# Patient Record
Sex: Female | Born: 1990 | Race: White | Hispanic: Yes | Marital: Single | Smoking: Former smoker
Health system: Southern US, Community
[De-identification: ages and names within clinical notes are randomized; demographics above are authoritative.]

## PROBLEM LIST (undated history)

## (undated) DIAGNOSIS — F419 Anxiety disorder, unspecified: Secondary | ICD-10-CM

## (undated) DIAGNOSIS — J45909 Unspecified asthma, uncomplicated: Secondary | ICD-10-CM

## (undated) DIAGNOSIS — E282 Polycystic ovarian syndrome: Secondary | ICD-10-CM

## (undated) DIAGNOSIS — N809 Endometriosis, unspecified: Secondary | ICD-10-CM

## (undated) DIAGNOSIS — R519 Headache, unspecified: Secondary | ICD-10-CM

## (undated) DIAGNOSIS — F32A Depression, unspecified: Secondary | ICD-10-CM

## (undated) DIAGNOSIS — Z809 Family history of malignant neoplasm, unspecified: Secondary | ICD-10-CM

## (undated) DIAGNOSIS — Z8049 Family history of malignant neoplasm of other genital organs: Secondary | ICD-10-CM

## (undated) HISTORY — DX: Family history of malignant neoplasm of other genital organs: Z80.49

## (undated) HISTORY — DX: Family history of malignant neoplasm, unspecified: Z80.9

---
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
## 2017-07-07 DIAGNOSIS — J4530 Mild persistent asthma, uncomplicated: Secondary | ICD-10-CM | POA: Insufficient documentation

## 2017-07-07 DIAGNOSIS — F988 Other specified behavioral and emotional disorders with onset usually occurring in childhood and adolescence: Secondary | ICD-10-CM | POA: Insufficient documentation

## 2017-07-07 DIAGNOSIS — M7021 Olecranon bursitis, right elbow: Secondary | ICD-10-CM | POA: Insufficient documentation

## 2017-07-07 DIAGNOSIS — R102 Pelvic and perineal pain: Secondary | ICD-10-CM | POA: Insufficient documentation

## 2017-09-14 ENCOUNTER — Emergency Department (HOSPITAL_COMMUNITY)
Admission: EM | Admit: 2017-09-14 | Discharge: 2017-09-14 | Disposition: A | Discharge status: Home / Self Care | Payer: BLUE CROSS/BLUE SHIELD | Attending: Emergency Medicine | Admitting: Emergency Medicine

## 2017-09-14 ENCOUNTER — Other Ambulatory Visit: Payer: Self-pay

## 2017-09-14 ENCOUNTER — Encounter (HOSPITAL_COMMUNITY): Payer: Self-pay | Admitting: Emergency Medicine

## 2017-09-14 ENCOUNTER — Emergency Department (HOSPITAL_COMMUNITY): Payer: BLUE CROSS/BLUE SHIELD

## 2017-09-14 DIAGNOSIS — N83202 Unspecified ovarian cyst, left side: Secondary | ICD-10-CM | POA: Insufficient documentation

## 2017-09-14 DIAGNOSIS — N83201 Unspecified ovarian cyst, right side: Secondary | ICD-10-CM | POA: Insufficient documentation

## 2017-09-14 DIAGNOSIS — Z79899 Other long term (current) drug therapy: Secondary | ICD-10-CM | POA: Insufficient documentation

## 2017-09-14 LAB — COMPREHENSIVE METABOLIC PANEL
ALK PHOS: 54 U/L (ref 38–126)
ALT: 16 U/L (ref 14–54)
ANION GAP: 9 (ref 5–15)
AST: 20 U/L (ref 15–41)
Albumin: 3.9 g/dL (ref 3.5–5.0)
BUN: 13 mg/dL (ref 6–20)
CHLORIDE: 102 mmol/L (ref 101–111)
CO2: 26 mmol/L (ref 22–32)
Calcium: 8.7 mg/dL — ABNORMAL LOW (ref 8.9–10.3)
Creatinine, Ser: 0.73 mg/dL (ref 0.44–1.00)
GFR calc Af Amer: >60 mL/min (ref >60)
GFR calc non Af Amer: >60 mL/min (ref >60)
GLUCOSE: 96 mg/dL (ref 65–99)
POTASSIUM: 3.9 mmol/L (ref 3.5–5.1)
Sodium: 137 mmol/L (ref 135–145)
Total Bilirubin: 0.8 mg/dL (ref 0.3–1.2)
Total Protein: 6.4 g/dL — ABNORMAL LOW (ref 6.5–8.1)

## 2017-09-14 LAB — URINALYSIS, ROUTINE W REFLEX MICROSCOPIC
BILIRUBIN URINE: NEGATIVE
Bacteria, UA: NONE SEEN
GLUCOSE, UA: NEGATIVE mg/dL
KETONES UR: NEGATIVE mg/dL
LEUKOCYTES UA: NEGATIVE
Nitrite: NEGATIVE
PH: 5 (ref 5.0–8.0)
PROTEIN: NEGATIVE mg/dL
Specific Gravity, Urine: 1.02 (ref 1.005–1.030)

## 2017-09-14 LAB — CBC
HEMATOCRIT: 37.8 % (ref 36.0–46.0)
HEMOGLOBIN: 12.5 g/dL (ref 12.0–15.0)
MCH: 31.7 pg (ref 26.0–34.0)
MCHC: 33.1 g/dL (ref 30.0–36.0)
MCV: 95.9 fL (ref 78.0–100.0)
Platelets: 272 10*3/uL (ref 150–400)
RBC: 3.94 MIL/uL (ref 3.87–5.11)
RDW: 13 % (ref 11.5–15.5)
WBC: 7.5 10*3/uL (ref 4.0–10.5)

## 2017-09-14 LAB — I-STAT BETA HCG BLOOD, ED (MC, WL, AP ONLY): I-stat hCG, quantitative: <5 m[IU]/mL (ref <5)

## 2017-09-14 LAB — LIPASE, BLOOD: Lipase: 29 U/L (ref 11–51)

## 2017-09-14 MED ORDER — KETOROLAC TROMETHAMINE 15 MG/ML IJ SOLN
15.0000 mg | Freq: Once | INTRAMUSCULAR | Status: AC
  Administered 2017-09-14: 15 mg via INTRAVENOUS
  Filled 2017-09-14: qty 1

## 2017-09-14 MED ORDER — SODIUM CHLORIDE 0.9 % IV BOLUS (SEPSIS)
1000.0000 mL | Freq: Once | INTRAVENOUS | Status: AC
  Administered 2017-09-14: 1000 mL via INTRAVENOUS

## 2017-09-14 MED ORDER — IOPAMIDOL (ISOVUE-300) INJECTION 61%
INTRAVENOUS | Status: AC
Start: 2017-09-14 — End: 2017-09-14
  Administered 2017-09-14: 100 mL
  Filled 2017-09-14: qty 100

## 2017-09-14 NOTE — ED Notes (Signed)
Pt cannot void at this time but is aware of need for UA

## 2017-09-14 NOTE — ED Triage Notes (Signed)
Patient with abdominal pain off and on since August.  Patient states that they thought that she might have endometriosis.  She has been having a constant sharp pain on the right side of her abdomen.  She is having a burning sensation.  It radiates from vagina to her rectum.  Patient does have some nausea, no vomiting.

## 2017-09-14 NOTE — ED Provider Notes (Signed)
[facility] EMERGENCY DEPARTMENT Provider Note   CSN: [id number] Arrival date & time: 09/14/17  6160     History   Chief Complaint Chief Complaint  Patient presents with  . Abdominal Pain    HPI Lori Collier is a 26 y.o. female.  26 year old female presents with complaint of abdominal pain.  Patient reports that she has had intermittent abdominal pain on and off for several weeks.  Patient reports that her pain may have been ongoing since August.  She reports the pain was slightly worse today so she decided to come to the ED.  Patient took a Naprosyn prior to arrival which has now resolved her pain.  Patient denies associated nausea, vomiting, fever, bowel movement change, urinary change, vaginal bleeding, or other complaint.  Patient reports that she has been told that she has cyst on her ovaries in the past.  She describes the pain as being a crampy discomfort to the lower abdomen.  Patient reports the pain is sometimes on the right and sometimes on the left.  She reports prior appendectomy.  Denies history of STD.  She denies any other abdominal surgeries.   The history is provided by the patient.  Abdominal Pain   This is a chronic problem. The current episode started more than 1 week ago. The problem occurs every several days. The problem has been resolved. The pain is located in the RLQ and LLQ. The patient is experiencing no pain. Nothing relieves the symptoms.    History reviewed. No pertinent past medical history.  There are no active problems to display for this patient.   Past Surgical History:  Procedure Laterality Date  . APPENDECTOMY      OB History    No data available       Home Medications    Prior to Admission medications   Medication Sig Start Date End Date Taking? Authorizing Provider  ADDERALL XR 10 MG 24 hr capsule Take 10 mg by mouth daily. 09/13/17  Yes [provider]  mometasone-formoterol (DULERA) 100-5 MCG/ACT  AERO Inhale 2 puffs into the lungs daily.   Yes [provider]  naproxen sodium (ALEVE) 220 MG tablet Take 220 mg by mouth 2 (two) times daily as needed (pain).   Yes [provider]  phenazopyridine (PYRIDIUM) 100 MG tablet 1 po tid with food Patient not taking: Reported on 09/14/2017 09/21/11   Lily Kocher, PA-C    Family History No family history on file.  Social History Social History   Tobacco Use  . Smoking status: Never Smoker  Substance Use Topics  . Alcohol use: No  . Drug use: No     Allergies   Patient has no known allergies.   Review of Systems Review of Systems  Gastrointestinal: Positive for abdominal pain.  All other systems reviewed and are negative.    Physical Exam Updated Vital Signs BP 101/64 (BP Location: Right Arm)   Pulse 79   Temp 98.3 F (36.8 C) (Oral)   Resp 16   Ht 5\' 5"  (1.651 m)   Wt 56.2 kg (124 lb)   LMP 09/14/2017   SpO2 100%   BMI 20.63 kg/m   Physical Exam  Constitutional: She is oriented to person, place, and time. She appears well-developed and well-nourished. No distress.  HENT:  Head: Normocephalic and atraumatic.  Mouth/Throat: Oropharynx is clear and moist.  Eyes: Conjunctivae and EOM are normal. Pupils are equal, round, and reactive to light.  Neck: Normal  range of motion. Neck supple.  Cardiovascular: Normal rate, regular rhythm and normal heart sounds.  Pulmonary/Chest: Effort normal and breath sounds normal. No respiratory distress.  Abdominal: Soft. Bowel sounds are normal. She exhibits no distension. There is no tenderness.  Musculoskeletal: Normal range of motion. She exhibits no edema or deformity.  Neurological: She is alert and oriented to person, place, and time.  Skin: Skin is warm and dry.  Psychiatric: She has a normal mood and affect.  Nursing note and vitals reviewed.    ED Treatments / Results  Labs (all labs ordered are listed, but only abnormal results are displayed) Labs  Reviewed  COMPREHENSIVE METABOLIC PANEL - Abnormal; Notable for the following components:      Result Value   Calcium 8.7 (*)    Total Protein 6.4 (*)    All other components within normal limits  URINALYSIS, ROUTINE W REFLEX MICROSCOPIC - Abnormal; Notable for the following components:   Hgb urine dipstick SMALL (*)    Squamous Epithelial / LPF 0-5 (*)    All other components within normal limits  LIPASE, BLOOD  CBC  URINALYSIS, ROUTINE W REFLEX MICROSCOPIC  I-STAT BETA HCG BLOOD, ED ([facility], [facility], [facility] ONLY)    EKG  EKG Interpretation None       Radiology Ct Abdomen Pelvis W Contrast  Result Date: 09/14/2017 CLINICAL DATA:  Right lower quadrant pain since August. EXAM: CT ABDOMEN AND PELVIS WITH CONTRAST TECHNIQUE: Multidetector CT imaging of the abdomen and pelvis was performed using the standard protocol following bolus administration of intravenous contrast. CONTRAST:  179mL ISOVUE-300 IOPAMIDOL (ISOVUE-300) INJECTION 61% COMPARISON:  CT abdomen pelvis dated October 28, 2010. FINDINGS: Lower chest: No acute abnormality. Hepatobiliary: Two subcentimeter low-density lesions in the right liver are too small to characterize. No gallstones, gallbladder wall thickening, or biliary dilatation. Pancreas: Unremarkable. No pancreatic ductal dilatation or surrounding inflammatory changes. Spleen: Normal in size without focal abnormality. Adrenals/Urinary Tract: Adrenal glands are unremarkable. Kidneys are normal, without renal calculi, focal lesion, or hydronephrosis. Bladder is unremarkable. Stomach/Bowel: Stomach is within normal limits. Appendix is surgically absent. No evidence of bowel wall thickening, distention, or inflammatory changes. Vascular/Lymphatic: No significant vascular findings are present. No enlarged abdominal or pelvic lymph nodes. Reproductive: The uterus is unremarkable. There are low-density, cystic lesions in both adnexa. The one on the right measures 7.2 x 4.2 cm, and one on the  left measures 5.2 x 2.8 cm. Other: Trace free fluid in the pelvis, likely physiologic. No abdominal wall hernia or abnormality. No pneumoperitoneum. Musculoskeletal: No acute or significant osseous findings. IMPRESSION: 1. Benign-appearing bilateral adnexal low-density cystic lesions, measuring up to 7.2 cm. Pelvic ultrasound in 6-12 weeks is recommended to evaluate for resolution. This recommendation follows ACR consensus guidelines: White Paper of the ACR Incidental Findings Committee II on Adnexal Findings. J Am Coll Radiol [phone]. Electronically Signed   By: Titus Dubin M.D.   On: 09/14/2017 12:44    Procedures Procedures (including critical care time)  Medications Ordered in ED Medications  sodium chloride 0.9 % bolus 1,000 mL (0 mLs Intravenous Stopped 09/14/17 1253)  iopamidol (ISOVUE-300) 61 % injection (100 mLs  Contrast Given 09/14/17 1200)  ketorolac (TORADOL) 15 MG/ML injection 15 mg (15 mg Intravenous Given 09/14/17 1318)     Initial Impression / Assessment and Plan / ED Course  I have reviewed the triage vital signs and the nursing notes.  Pertinent labs & imaging results that were available during my care of the patient were  reviewed by me and considered in my medical decision making (see chart for details).     MSE complete  Patient's chronic pain is most likely related to her ovarian cysts - perhaps intermittent leakage from the cyst.  I do not suspect ovarian torsion.  There is no evidence of other significant pathology on her ED workup.  The patient has been pain-free and comfortable during her ED workup.  She has been instructed that close follow-up with GYN for repeat imaging studies within the next 6 weeks as appropriate.  She was instructed in the use of anti-inflammatories such as Naprosyn (or toradol) for pain control.   Strict return precautions were given and understood.  She was educated about the distant possibility of ovarian torsion and understands  the specific return instructions regarding ovarian torsion.     Final Clinical Impressions(s) / ED Diagnoses   Final diagnoses:  Cysts of both ovaries    ED Discharge Orders    None       Valarie Merino, MD 09/14/17 1342

## 2017-12-21 ENCOUNTER — Other Ambulatory Visit: Payer: Self-pay | Admitting: Advanced Practice Midwife

## 2017-12-21 DIAGNOSIS — R102 Pelvic and perineal pain: Secondary | ICD-10-CM

## 2017-12-21 DIAGNOSIS — N83209 Unspecified ovarian cyst, unspecified side: Secondary | ICD-10-CM

## 2018-01-05 ENCOUNTER — Ambulatory Visit
Admission: RE | Admit: 2018-01-05 | Discharge: 2018-01-05 | Disposition: A | Discharge status: Home / Self Care | Payer: Self-pay | Source: Ambulatory Visit | Attending: Advanced Practice Midwife | Admitting: Advanced Practice Midwife

## 2018-01-05 DIAGNOSIS — R102 Pelvic and perineal pain: Secondary | ICD-10-CM

## 2018-01-05 DIAGNOSIS — N83209 Unspecified ovarian cyst, unspecified side: Secondary | ICD-10-CM

## 2018-02-15 ENCOUNTER — Encounter: Payer: Self-pay | Admitting: Obstetrics & Gynecology

## 2018-02-15 ENCOUNTER — Ambulatory Visit (INDEPENDENT_AMBULATORY_CARE_PROVIDER_SITE_OTHER): Payer: Self-pay | Admitting: Obstetrics & Gynecology

## 2018-02-15 VITALS — BP 112/78 | Ht 64.25 in | Wt 118.0 lb

## 2018-02-15 DIAGNOSIS — R102 Pelvic and perineal pain: Secondary | ICD-10-CM

## 2018-02-15 DIAGNOSIS — N83202 Unspecified ovarian cyst, left side: Secondary | ICD-10-CM

## 2018-02-15 DIAGNOSIS — N83201 Unspecified ovarian cyst, right side: Secondary | ICD-10-CM

## 2018-02-15 DIAGNOSIS — N898 Other specified noninflammatory disorders of vagina: Secondary | ICD-10-CM

## 2018-02-15 LAB — WET PREP FOR TRICH, YEAST, CLUE

## 2018-02-15 MED ORDER — TINIDAZOLE 500 MG PO TABS: 2.0000 g | ORAL_TABLET | Freq: Every day | ORAL | 0 refills | Status: AC

## 2018-02-15 MED ORDER — NORGESTIMATE-ETH ESTRADIOL 0.25-35 MG-MCG PO TABS: 1.0000 | ORAL_TABLET | Freq: Every day | ORAL | 4 refills | Status: DC

## 2018-02-15 NOTE — Progress Notes (Signed)
Lori Collier Sep 19, 1991 [id number]        27 y.o.  G0 Single  RP: Pelvic pain with persistent ovarian cysts  HPI: Menstrual periods are not regular but no oligomenorrhea.  Periods about every 3 to 4 weeks.  Normal flow.  Patient complains of frequent low abdominal pain not always in the same location.  Currently abstinent, but has had many sexual partners in the past.  Condoms if sexually active.  No history of STDs.  Has had ovarian cysts per pelvic ultrasound and possibly ovarian cyst rupture at times.  She had a pelvic ultrasound in October 2018 and then a repeat US in March 2019.  On both occasions there were multiple ovarian cysts.  Minor facial acne and very mild increase hair growth at the chin and at the areolas of both breasts.  Patient has not been investigated for polycystic ovarian syndrome.  No previous gynecologic surgery.  Patient was reluctant of being started on birth control pills as she felt that it did not prevent ovarian cysts in the past.  She has tried to improve her condition by modifying her nutrition.   OB History  Gravida Para Term Preterm AB Living  0 0 0 0 0 0  SAB TAB Ectopic Multiple Live Births  0 0 0 0 0    Past medical history,surgical history, problem list, medications, allergies, family history and social history were all reviewed and documented in the EPIC chart.   Directed ROS with pertinent positives and negatives documented in the history of present illness/assessment and plan.  Exam:  Vitals:   02/15/18 1433  BP: 112/78  Weight: 118 lb (53.5 kg)  Height: 5' 4.25" (1.632 m)   General appearance:  Normal  Abdomen: Normal  Gynecologic exam: Vulva normal.  Speculum: Cervix and vagina normal.  Increased vaginal discharge.  Wet prep was done.  Declines STD screening.   Bimanual: Uterus anteverted, normal volume, nontender, mobile.  Cervix nontender to mobilization.  Ovaries mildly increased in volume, but no large cyst palpated.  Very mildly  tender bilaterally.  Wet prep:  Clue cells present   Assessment/Plan:  27 y.o. G0  1. Pelvic pain in female Pelvic pain probably associated with PCOS and occasional rupture of ovarian cysts.  Labs drawn today and patient started on Sprintec.  Usage risks and benefits of birth control pill reviewed.  Will follow up with a pelvic ultrasound in 2 months. - US Transvaginal Non-OB; Future  2. Bilateral ovarian cysts Menstrual cycles are every 3 to 4 weeks currently.  Patient has been on Implanon and birth control pills before.  Persistent bilateral ovarian cysts possibly associated with PCOS, but endometrioma or benign ovarian tumor not ruled out.  No large ovarian cyst or adnexal mass felt on exam today.  Diagnosis of PCOS and treatment reviewed with patient.  Decision to start birth control pill with Sprintec.  Usage, risks and benefits reviewed with patient.  Patient is currently abstinent, will start the pill today but will not count on it for contraception with the first pack.  Condoms for STD prevention recommended anyways.  Labs today and follow-up with a pelvic ultrasound in 2 months.  Management per clinical, lab and ultrasound findings at follow-up. - TSH - Prolactin - Hemoglobin A1C - US Transvaginal Non-OB; Future  3. Vaginal discharge Increased vaginal discharge on exam.  Wet prep showed clue cells.  Bacterial Vaginosis diagnosis discussed with patient.  Will treat with tinidazole 4 tablets daily for 2  days.  Usage, risks and benefits reviewed.  Prescription sent to pharmacy.  Other orders - norgestimate-ethinyl estradiol (ORTHO-CYCLEN,SPRINTEC,PREVIFEM) 0.25-35 MG-MCG tablet; Take 1 tablet by mouth daily. - tinidazole (TINDAMAX) 500 MG tablet; Take 4 tablets (2,000 mg total) by mouth daily for 2 days.  Counseling on above issues and coordination of care more than 50% for 45 minutes.  [name] MD, 2:47 PM 02/15/2018

## 2018-02-16 ENCOUNTER — Encounter: Payer: Self-pay | Admitting: Obstetrics & Gynecology

## 2018-02-16 LAB — HEMOGLOBIN A1C
HEMOGLOBIN A1C: 5.2 %{Hb} (ref <5.7)
MEAN PLASMA GLUCOSE: 103 (calc)
eAG (mmol/L): 5.7 (calc)

## 2018-02-16 LAB — PROLACTIN: Prolactin: 5.4 ng/mL

## 2018-02-16 LAB — TSH: TSH: 2.3 mIU/L

## 2018-02-16 NOTE — Patient Instructions (Addendum)
1. Pelvic pain in female Pelvic pain probably associated with PCOS and occasional rupture of ovarian cysts.  Labs drawn today and patient started on Sprintec.  Will follow up with a pelvic ultrasound in 2 months. - US Transvaginal Non-OB; Future  2. Bilateral ovarian cysts Menstrual cycles are every 3 to 4 weeks currently.  Patient has been on Implanon and birth control pills before.  Persistent bilateral ovarian cysts possibly associated with PCOS, but endometrioma or benign ovarian tumor not ruled out.  No large ovarian cyst or adnexal mass felt on exam today.  Diagnosis of PCOS and treatment reviewed with patient.  Decision to start birth control pill with Sprintec.  Usage, risks and benefits reviewed with patient.  Patient is currently abstinent, will start the pill today but will not count on it for contraception with the first pack.  Condoms for STD prevention recommended anyways.  Labs today and follow-up with a pelvic ultrasound in 2 months.  Management per clinical, lab and ultrasound findings at follow-up. - TSH - Prolactin - Hemoglobin A1C - US Transvaginal Non-OB; Future  3. Vaginal discharge Increased vaginal discharge on exam.  Wet prep showed clue cells.  Bacterial Vaginosis diagnosis discussed with patient.  Will treat with tinidazole 4 tablets daily for 2 days.  Usage, risks and benefits reviewed.  Prescription sent to pharmacy.  Other orders - norgestimate-ethinyl estradiol (ORTHO-CYCLEN,SPRINTEC,PREVIFEM) 0.25-35 MG-MCG tablet; Take 1 tablet by mouth daily. - tinidazole (TINDAMAX) 500 MG tablet; Take 4 tablets (2,000 mg total) by mouth daily for 2 days.  Abreanna, it was a pleasure meeting you today!  I will inform you of your results as soon as they are available.  I will see you back in 2 months for the pelvic ultrasound.  Please let me know if your pain worsens and you need to be reevaluated earlier.   Diet for Polycystic Ovarian Syndrome Polycystic ovary syndrome (PCOS) is  a disorder of the chemical messengers (hormones) that regulate menstruation. The condition causes important hormones to be out of balance. PCOS can:  Make your periods irregular or stop.  Cause cysts to develop on the ovaries.  Make it difficult to get pregnant.  Stop your body from responding to the effects of insulin (insulin resistance), which can lead to obesity and diabetes.  Changing what you eat can help manage PCOS and improve your health. It can help you lose weight and improve the way your body uses insulin. What is my plan?  Eat breakfast, lunch, and dinner plus two snacks every day.  Include protein in each meal and snack.  Choose whole grains instead of products made with refined flour.  Eat a variety of foods.  Exercise regularly as told by your health care provider. What do I need to know about this eating plan? If you are overweight or obese, pay attention to how many calories you eat. Cutting down on calories can help you lose weight. Work with your health care provider or dietitian to figure out how many calories you need each day. What foods can I eat? Grains Whole grains, such as whole wheat. Whole-grain breads, crackers, cereals, and pasta. Unsweetened oatmeal, bulgur, barley, quinoa, or brown rice. Corn or whole-wheat flour tortillas. Vegetables  Lettuce. Spinach. Peas. Beets. Cauliflower. Cabbage. Broccoli. Carrots. Tomatoes. Squash. Eggplant. Herbs. Peppers. Onions. Cucumbers. Brussels sprouts. Fruits Berries. Bananas. Apples. Oranges. Grapes. Papaya. Mango. Pomegranate. Kiwi. Grapefruit. Cherries. Meats and Other Protein Sources Lean proteins, such as fish, chicken, beans, eggs, and tofu. Dairy Low-fat  dairy products, such as skim milk, cheese sticks, and yogurt. Beverages Low-fat or fat-free drinks, such as water, low-fat milk, sugar-free drinks, and 100% fruit juice. Condiments Ketchup. Mustard. Barbecue sauce. Relish. Low-fat or fat-free  mayonnaise. Fats and Oils Olive oil or canola oil. Walnuts and almonds. The items listed above may not be a complete list of recommended foods or beverages. Contact your dietitian for more options. What foods are not recommended? Foods high in calories or fat. Fried foods. Sweets. Products made from refined white flour, including white bread, pastries, white rice, and pasta. The items listed above may not be a complete list of foods and beverages to avoid. Contact your dietitian for more information. This information is not intended to replace advice given to you by your health care provider. Make sure you discuss any questions you have with your health care provider. Document Released: 01/20/2016 Document Revised: 03/05/2016 Document Reviewed: 10/10/2014 Elsevier Interactive Patient Education  2018 [employer].  Polycystic Ovarian Syndrome Polycystic ovarian syndrome (PCOS) is a common hormonal disorder among women of reproductive age. In most women with PCOS, many small fluid-filled sacs (cysts) grow on the ovaries, and the cysts are not part of a normal menstrual cycle. PCOS can cause problems with your menstrual periods and make it difficult to get pregnant. It can also cause an increased risk of miscarriage with pregnancy. If it is not treated, PCOS can lead to serious health problems, such as diabetes and heart disease. What are the causes? The cause of PCOS is not known, but it may be the result of a combination of certain factors, such as:  Irregular menstrual cycle.  High levels of certain hormones (androgens).  Problems with the hormone that helps to control blood sugar (insulin resistance).  Certain genes.  What increases the risk? This condition is more likely to develop in women who have a family history of PCOS. What are the signs or symptoms? Symptoms of PCOS may include:  Multiple ovarian cysts.  Infrequent periods or no periods.  Periods that are too frequent or  too heavy.  Unpredictable periods.  Inability to get pregnant (infertility) because of not ovulating.  Increased growth of hair on the face, chest, stomach, back, thumbs, thighs, or toes.  Acne or oily skin. Acne may develop during adulthood, and it may not respond to treatment.  Pelvic pain.  Weight gain or obesity.  Patches of thickened and dark brown or black skin on the neck, arms, breasts, or thighs (acanthosis nigricans).  Excess hair growth on the face, chest, abdomen, or upper thighs (hirsutism).  How is this diagnosed? This condition is diagnosed based on:  Your medical history.  A physical exam, including a pelvic exam. Your health care provider may look for areas of increased hair growth on your skin.  Tests, such as: ? Ultrasound. This may be used to examine the ovaries and the lining of the uterus (endometrium) for cysts. ? Blood tests. These may be used to check levels of sugar (glucose), female hormone (testosterone), and female hormones (estrogen and progesterone) in your blood.  How is this treated? There is no cure for PCOS, but treatment can help to manage symptoms and prevent more health problems from developing. Treatment varies depending on:  Your symptoms.  Whether you want to have a baby or whether you need birth control (contraception).  Treatment may include nutrition and lifestyle changes along with:  Progesterone hormone to start a menstrual period.  Birth control pills to help you have  regular menstrual periods.  Medicines to make you ovulate, if you want to get pregnant.  Medicine to reduce excessive hair growth.  Surgery, in severe cases. This may involve making small holes in one or both of your ovaries. This decreases the amount of testosterone that your body produces.  Follow these instructions at home:  Take over-the-counter and prescription medicines only as told by your health care provider.  Follow a healthy meal plan. This can  help you reduce the effects of PCOS. ? Eat a healthy diet that includes lean proteins, complex carbohydrates, fresh fruits and vegetables, low-fat dairy products, and healthy fats. Make sure to eat enough fiber.  If you are overweight, lose weight as told by your health care provider. ? Losing 10% of your body weight may improve symptoms. ? Your health care provider can determine how much weight loss is best for you and can help you lose weight safely.  Keep all follow-up visits as told by your health care provider. This is important. Contact a health care provider if:  Your symptoms do not get better with medicine.  You develop new symptoms. This information is not intended to replace advice given to you by your health care provider. Make sure you discuss any questions you have with your health care provider. Document Released: 01/22/2005 Document Revised: 05/26/2016 Document Reviewed: 03/15/2016 Elsevier Interactive Patient Education  2018 [employer].   Kegel Exercises Kegel exercises help strengthen the muscles that support the rectum, vagina, small intestine, bladder, and uterus. Doing Kegel exercises can help:  Improve bladder and bowel control.  Improve sexual response.  Reduce problems and discomfort during pregnancy.  Kegel exercises involve squeezing your pelvic floor muscles, which are the same muscles you squeeze when you try to stop the flow of urine. The exercises can be done while sitting, standing, or lying down, but it is best to vary your position. Phase 1 exercises 1. Squeeze your pelvic floor muscles tight. You should feel a tight lift in your rectal area. If you are a female, you should also feel a tightness in your vaginal area. Keep your stomach, buttocks, and legs relaxed. 2. Hold the muscles tight for up to 10 seconds. 3. Relax your muscles. Repeat this exercise 50 times a day or as many times as told by your health care provider. Continue to do this  exercise for at least 4-6 weeks or for as long as told by your health care provider. This information is not intended to replace advice given to you by your health care provider. Make sure you discuss any questions you have with your health care provider. Document Released: 09/14/2012 Document Revised: 05/23/2016 Document Reviewed: 08/18/2015 Elsevier Interactive Patient Education  [employer].

## 2018-02-18 ENCOUNTER — Encounter: Payer: Self-pay | Admitting: Obstetrics & Gynecology

## 2018-03-08 ENCOUNTER — Telehealth: Payer: Self-pay | Admitting: *Deleted

## 2018-03-08 ENCOUNTER — Encounter: Payer: Self-pay | Admitting: Obstetrics & Gynecology

## 2018-03-08 NOTE — Telephone Encounter (Signed)
Patient was seen on 02/15/18 states you wanted her to stop ovulation while on pills? She couldn't remember if you wanted her to skip placebo pills and start into a new pack? Please advise

## 2018-03-08 NOTE — Telephone Encounter (Signed)
Yes, recommend continuous use to skip menses.  So, take only active pills, discard placebo and go right into next pack with active pills.Marland KitchenMarland Kitchen

## 2018-03-09 NOTE — Telephone Encounter (Signed)
Pt informed

## 2018-03-16 ENCOUNTER — Telehealth: Payer: Self-pay | Admitting: *Deleted

## 2018-03-16 DIAGNOSIS — R399 Unspecified symptoms and signs involving the genitourinary system: Secondary | ICD-10-CM

## 2018-03-16 NOTE — Telephone Encounter (Signed)
Patient called c/o several different issues frequent urination, slight burning with urination, external vaginal itching. I advised her OV best for correct medication, pt declined stating she doesn't have insurance at the time. She wanted me to check with you to see if you would send in Rx for the above symptoms? I did tell her I can't promise you would approve. Please advise

## 2018-03-17 NOTE — Telephone Encounter (Signed)
Spoke with patient and advised her. Lab appointment scheduled for tomorrow (could not come today) and u/a w reflex culture order placed.

## 2018-03-17 NOTE — Telephone Encounter (Signed)
Tell patient, if she cannot pay for a visit, she needs to at least come for a clean catch U/A and Urine Culture before any treatment is decided.

## 2018-03-18 ENCOUNTER — Other Ambulatory Visit: Payer: Self-pay

## 2018-04-18 ENCOUNTER — Other Ambulatory Visit: Payer: Self-pay

## 2018-04-18 ENCOUNTER — Ambulatory Visit: Payer: Self-pay | Admitting: Obstetrics & Gynecology

## 2018-10-17 ENCOUNTER — Ambulatory Visit: Payer: BLUE CROSS/BLUE SHIELD | Admitting: Obstetrics & Gynecology

## 2018-10-17 ENCOUNTER — Encounter: Payer: Self-pay | Admitting: Obstetrics & Gynecology

## 2018-10-17 VITALS — BP 106/68

## 2018-10-17 DIAGNOSIS — N83202 Unspecified ovarian cyst, left side: Secondary | ICD-10-CM | POA: Diagnosis not present

## 2018-10-17 NOTE — Progress Notes (Signed)
    NOTNAMED Lori Collier November 16, 1990 [id number]        28 y.o.  G0P0000 Single  RP: Ovarian cyst on Gyn exam 09/26/2018  HPI: Recent visit at [facility] ED, probable Ovarian Cyst on 09/26/2018.  Started on BCPs Sprintec at that visit, doing better since started with no current pelvic pain.  No abnormal bleeding.  H/O frequent Ovarian Cysts with occasional rupture.  No fever.  Urine/BMs normal.   OB History  Gravida Para Term Preterm AB Living  0 0 0 0 0 0  SAB TAB Ectopic Multiple Live Births  0 0 0 0 0    Past medical history,surgical history, problem list, medications, allergies, family history and social history were all reviewed and documented in the EPIC chart.   Directed ROS with pertinent positives and negatives documented in the history of present illness/assessment and plan.  Exam:  Vitals:   10/17/18 1216  BP: 106/68   General appearance:  Normal  Abdomen: Normal, soft, non-distended  Gynecologic exam: Vulva normal.  Speculum:  Cervix/Vagina normal.  Bimanual exam:  Uterus RV, normal volume, mobile, NT.  Left adnexal mass bulging in vagina, probable ovarian cyst, about 5 cm, mobile, NT.   Assessment/Plan:  28 y.o. G0  1. Left ovarian cyst About 5 cm left adnexal/probably ovarian cyst.  No current pelvic pain.  Well on Sprintec BCPs.  No CI to continue on BCPs.  Precautions on rupture and torsion given, will call for evaluation if develops a severe pelvic/abdominal pain.  F/U Pelvic US to evaluate the Left Adnexal mass.  Management per Pelvic US results and clinical state at follow up.  Possibility of needing a surgical treatment discussed. - US Transvaginal Non-OB; Future  Counseling on above issues and coordination of care >50% x 25 minutes. [name] MD, 12:35 PM 10/17/2018

## 2018-10-17 NOTE — Patient Instructions (Signed)
1. Left ovarian cyst About 5 cm left adnexal/probably ovarian cyst.  No current pelvic pain.  Well on Sprintec BCPs.  No CI to continue on BCPs.  Precautions on rupture and torsion given, will call for evaluation if develops a severe pelvic/abdominal pain.  F/U Pelvic US to evaluate the Left Adnexal mass.  Management per Pelvic US results and clinical state at follow up.  Possibility of needing a surgical treatment discussed. - US Transvaginal Non-OB; Future  Lori Collier, it was a pleasure seeing you today!

## 2018-10-27 ENCOUNTER — Ambulatory Visit (INDEPENDENT_AMBULATORY_CARE_PROVIDER_SITE_OTHER): Payer: BLUE CROSS/BLUE SHIELD | Admitting: Obstetrics & Gynecology

## 2018-10-27 ENCOUNTER — Encounter: Payer: Self-pay | Admitting: Obstetrics & Gynecology

## 2018-10-27 ENCOUNTER — Ambulatory Visit (INDEPENDENT_AMBULATORY_CARE_PROVIDER_SITE_OTHER): Payer: BLUE CROSS/BLUE SHIELD

## 2018-10-27 VITALS — BP 112/70

## 2018-10-27 DIAGNOSIS — N83202 Unspecified ovarian cyst, left side: Secondary | ICD-10-CM

## 2018-10-27 DIAGNOSIS — N83201 Unspecified ovarian cyst, right side: Secondary | ICD-10-CM

## 2018-10-27 NOTE — Progress Notes (Signed)
    AGUSTINA WITZKE 1991-07-28 [id number]        28 y.o.  G0P0000   RP: Left Ovarian Cyst for Pelvic US  HPI: Visit at [facility] ED on 09/26/2018, probable Ovarian Cyst on exam.  Started on BCPs Sprintec at that visit, doing better since started but now pelvic pain is recurring.  No abnormal bleeding.  H/O frequent Ovarian Cysts with occasional rupture.  No fever.  Urine/BMs normal.    OB History  Gravida Para Term Preterm AB Living  0 0 0 0 0 0  SAB TAB Ectopic Multiple Live Births  0 0 0 0 0    Past medical history,surgical history, problem list, medications, allergies, family history and social history were all reviewed and documented in the EPIC chart.   Directed ROS with pertinent positives and negatives documented in the history of present illness/assessment and plan.  Exam:  Vitals:   10/27/18 1626  BP: 112/70   General appearance:  Normal  Pelvic US today: T/V images.  Uterus normal size and shape homogeneous measuring 7.89 x 4.57 x 3.22 cm.  Endometrial lining symmetrical and normal measuring 7.7 mm.  Bilateral avascular ovarian cysts with debris's.  Left ovarian cyst measuring 6.9 x 5.4 x 4.2 cm (stable compared to last pelvic ultrasound in May 2019).  Right ovarian cyst measuring 7.9 x 7.3 x 6.0 cm (slightly larger compared to last pelvic ultrasound in May 2019).  Ovarian masses are mobile and nontender.  No free fluid in the posterior cul-de-sac.   Assessment/Plan:  28 y.o. G0P0000   1. Bilateral ovarian cysts Longstanding h/o intermittent pelvic pain with Ovarian cysts and cysts rupture.  Recurring pain even on BCPs currently.  Pelvic US findings reviewed with patient.  Bilateral Ovarian Cysts c/w Endometriomas measuring 6.9 cm on the Left Ovary and 7.9 cm on the right Ovary.  Decision to proceed with a Robotic Laparoscopy Bilateral Ovarian Cystectomies, conservative treatment of Endometriosis.  Procedure, risks, benefits reviewed with patient.  F/U Preop  visit.  Counseling on above issues and coordination of care >50% x 26 minutes.  [name] MD, 4:35 PM 10/27/2018

## 2018-10-31 ENCOUNTER — Telehealth: Payer: Self-pay | Admitting: *Deleted

## 2018-10-31 NOTE — Telephone Encounter (Signed)
Patient currently started back on Sprintec 1 1/2 month ago, states pills are causing nausea and lots of cramping, has ovarian cyst, patient said cramping is daily, nausea only last about 2 hour after taking pill then stops, takes with food. She asked if pill should be switched? Please advise

## 2018-10-31 NOTE — Telephone Encounter (Signed)
Can switch to a lower dosage BCP.  Lo-LoEstrin would be the lowest on the market, but no generic.  Or LoEstrin 1/20 generic.  Per patient's choice.

## 2018-11-01 NOTE — Telephone Encounter (Signed)
Left message for pt to call.

## 2018-11-01 NOTE — Telephone Encounter (Signed)
Patient said she wanted to try current pills longer will take at lunch with her biggest meal to see if this helps, if no improvement she will call back and new Rx will be sent for new pills listed below per her choice.

## 2018-11-03 ENCOUNTER — Encounter: Payer: Self-pay | Admitting: Obstetrics & Gynecology

## 2018-11-03 NOTE — Patient Instructions (Signed)
1. Bilateral ovarian cysts Longstanding h/o intermittent pelvic pain with Ovarian cysts and cysts rupture.  Recurring pain even on BCPs currently.  Pelvic US findings reviewed with patient.  Bilateral Ovarian Cysts c/w Endometriomas measuring 6.9 cm on the Left Ovary and 7.9 cm on the right Ovary.  Decision to proceed with a Robotic Laparoscopy Bilateral Ovarian Cystectomies, conservative treatment of Endometriosis.  Procedure, risks, benefits reviewed with patient.  F/U Preop visit.  Lorah, it was a pleasure seeing you today!

## 2018-11-07 ENCOUNTER — Telehealth: Payer: Self-pay

## 2018-11-07 NOTE — Telephone Encounter (Signed)
I called patient and discussed scheduling surgery. I scheduled her for 12/12/18 at 7:30am at [facility]-  We reviewed her insurance benefits and her estimated surgery prepymt amount due by one week prior to surgery. Financial letter and [facility] pamphlet have been mailed to patient.  Pre op appt was scheduled for 11/18/18 at 10:00am.

## 2018-11-14 ENCOUNTER — Encounter: Payer: Self-pay | Admitting: Anesthesiology

## 2018-11-18 ENCOUNTER — Encounter: Payer: Self-pay | Admitting: Obstetrics & Gynecology

## 2018-11-18 ENCOUNTER — Ambulatory Visit: Payer: BLUE CROSS/BLUE SHIELD | Admitting: Obstetrics & Gynecology

## 2018-11-18 VITALS — BP 110/78 | Ht 65.0 in | Wt 120.0 lb

## 2018-11-18 DIAGNOSIS — N83202 Unspecified ovarian cyst, left side: Secondary | ICD-10-CM | POA: Diagnosis not present

## 2018-11-18 DIAGNOSIS — N83201 Unspecified ovarian cyst, right side: Secondary | ICD-10-CM

## 2018-11-18 NOTE — Patient Instructions (Signed)
1. Bilateral ovarian cysts Persistent bilateral ovarian cysts compatible with endometriomas measuring 6.9 cm on the left ovary and 7.9 cm on the right ovary.  Persistent pelvic pain unchanged since last visit.  Decision to proceed with a laparoscopy could bilateral ovarian cystectomy with conservative treatment of endometriosis.  Patient has a history of previous ruptured appendix with laparoscopic appendectomy.  Preop management, procedure and postop management reviewed thoroughly with patient.  Surgical risks discussed including the risk of trauma, the risk of infection, the risk of DVT/pulmonary embolism and the anesthesia risk.  Patient voiced understanding and agreement with plan.                         Patient was counseled as to the risk of surgery to include the following:  1. Infection (prohylactic antibiotics will be administered)  2. DVT/Pulmonary Embolism (prophylactic pneumo compression stockings will be used)  3.Trauma to internal organs requiring additional surgical procedure to repair any injury to internal organs requiring perhaps additional hospitalization days.  4.Hemmorhage requiring transfusion and blood products which carry risks such as anaphylactic reaction, hepatitis and AIDS  Patient had received literature information on the procedure scheduled and all her questions were answered and fully accepts all risk.  Lori Collier, it was a pleasure seeing you today!

## 2018-11-18 NOTE — Progress Notes (Signed)
    Lori Collier 26-Jan-1991 [id number]        27 y.o.  G0 Single  RP: Preop persistent painful bilateral Ovarian Cysts  HPI:  Pelvic pain persistent, but tolerable.  Pelvic US 02/2018 and 10/2018 showing stable persistent Rt and Lt Ovarian Cysts c/w an Endometriomas.  Menses normal every 3-4 weeks.  Currently abstinent.   OB History  Gravida Para Term Preterm AB Living  0 0 0 0 0 0  SAB TAB Ectopic Multiple Live Births  0 0 0 0 0    Past medical history,surgical history, problem list, medications, allergies, family history and social history were all reviewed and documented in the EPIC chart.   Directed ROS with pertinent positives and negatives documented in the history of present illness/assessment and plan.  Exam:  Vitals:   11/18/18 1004  BP: 110/78  Weight: 120 lb (54.4 kg)  Height: 5\' 5"  (1.651 m)   General appearance:  Normal  Gyn exam deferred  Pelvic US 10/27/2018:  T/V images. Uterus normal size and shape homogeneous measuring 7.89 x 4.57 x 3.22 cm. Endometrial lining symmetrical and normal measuring 7.7 mm. Bilateral avascular ovarian cysts with debris's. Left ovarian cyst measuring 6.9 x 5.4 x 4.2 cm (stable compared to last pelvic ultrasound in May 2019). Right ovarian cyst measuring 7.9 x 7.3 x 6.0 cm (slightly larger compared to last pelvic ultrasound in May 2019).Ovarian masses are mobile and nontender. No free fluid in the posterior cul-de-sac.   Assessment/Plan:  28 y.o. G0  1. Bilateral ovarian cysts Persistent bilateral ovarian cysts compatible with endometriomas measuring 6.9 cm on the left ovary and 7.9 cm on the right ovary.  Persistent pelvic pain unchanged since last visit.  Decision to proceed with a laparoscopy could bilateral ovarian cystectomy with conservative treatment of endometriosis.  Patient has a history of previous ruptured appendix with laparoscopic appendectomy.  Preop management, procedure and postop management reviewed thoroughly  with patient.  Surgical risks discussed including the risk of trauma, the risk of infection, the risk of DVT/pulmonary embolism and the anesthesia risk.  Patient voiced understanding and agreement with plan.                         Patient was counseled as to the risk of surgery to include the following:  1. Infection (prohylactic antibiotics will be administered)  2. DVT/Pulmonary Embolism (prophylactic pneumo compression stockings will be used)  3.Trauma to internal organs requiring additional surgical procedure to repair any injury to internal organs requiring perhaps additional hospitalization days.  4.Hemmorhage requiring transfusion and blood products which carry risks such as anaphylactic reaction, hepatitis and AIDS  Patient had received literature information on the procedure scheduled and all her questions were answered and fully accepts all risk.  Counseling on above issues and coordination of care more than 50% for 15 minutes.  [name] MD, 10:14 AM 11/18/2018

## 2018-12-01 ENCOUNTER — Telehealth: Payer: Self-pay

## 2018-12-01 NOTE — Telephone Encounter (Signed)
Patient called asking if at time of surgery Dr.ML will be doing a biopsy or a "culture sample".   I told her that everything Dr. Marguerita Merles removes will be sent to pathology to be reviewed microscopically.  I told her I was not aware of any "cultures" that would be done. She was happy knowing that all tissue removed would be viewed by pathology.

## 2018-12-07 ENCOUNTER — Other Ambulatory Visit: Payer: Self-pay

## 2018-12-07 ENCOUNTER — Encounter (HOSPITAL_BASED_OUTPATIENT_CLINIC_OR_DEPARTMENT_OTHER): Payer: Self-pay | Admitting: *Deleted

## 2018-12-07 NOTE — Progress Notes (Signed)
Spoke with patient via telephone for pre op interview. Patient to take Sprintec and Claritin AM of surgery with a sip of water. NPO after MN. Will need UPT, T & S and CBC AM of surgery. Arrival time 0530.

## 2018-12-12 ENCOUNTER — Ambulatory Visit (HOSPITAL_BASED_OUTPATIENT_CLINIC_OR_DEPARTMENT_OTHER): Payer: BLUE CROSS/BLUE SHIELD | Admitting: Anesthesiology

## 2018-12-12 ENCOUNTER — Encounter (HOSPITAL_BASED_OUTPATIENT_CLINIC_OR_DEPARTMENT_OTHER): Payer: Self-pay | Admitting: Emergency Medicine

## 2018-12-12 ENCOUNTER — Ambulatory Visit (HOSPITAL_BASED_OUTPATIENT_CLINIC_OR_DEPARTMENT_OTHER)
Admission: RE | Admit: 2018-12-12 | Discharge: 2018-12-12 | Disposition: A | Discharge status: Home / Self Care | Payer: BLUE CROSS/BLUE SHIELD | Attending: Obstetrics & Gynecology | Admitting: Obstetrics & Gynecology

## 2018-12-12 ENCOUNTER — Encounter (HOSPITAL_BASED_OUTPATIENT_CLINIC_OR_DEPARTMENT_OTHER)
Admission: RE | Disposition: A | Discharge status: Home / Self Care | Payer: Self-pay | Source: Home / Self Care | Attending: Obstetrics & Gynecology

## 2018-12-12 ENCOUNTER — Other Ambulatory Visit: Payer: Self-pay

## 2018-12-12 DIAGNOSIS — Z7951 Long term (current) use of inhaled steroids: Secondary | ICD-10-CM | POA: Diagnosis not present

## 2018-12-12 DIAGNOSIS — N803 Endometriosis of pelvic peritoneum: Secondary | ICD-10-CM | POA: Diagnosis not present

## 2018-12-12 DIAGNOSIS — N801 Endometriosis of ovary: Secondary | ICD-10-CM | POA: Diagnosis not present

## 2018-12-12 DIAGNOSIS — N83201 Unspecified ovarian cyst, right side: Secondary | ICD-10-CM | POA: Insufficient documentation

## 2018-12-12 DIAGNOSIS — N83292 Other ovarian cyst, left side: Secondary | ICD-10-CM | POA: Diagnosis not present

## 2018-12-12 DIAGNOSIS — N83202 Unspecified ovarian cyst, left side: Secondary | ICD-10-CM | POA: Insufficient documentation

## 2018-12-12 DIAGNOSIS — R102 Pelvic and perineal pain: Secondary | ICD-10-CM | POA: Diagnosis not present

## 2018-12-12 DIAGNOSIS — Z793 Long term (current) use of hormonal contraceptives: Secondary | ICD-10-CM | POA: Insufficient documentation

## 2018-12-12 DIAGNOSIS — N83291 Other ovarian cyst, right side: Secondary | ICD-10-CM | POA: Diagnosis not present

## 2018-12-12 DIAGNOSIS — Z79899 Other long term (current) drug therapy: Secondary | ICD-10-CM | POA: Insufficient documentation

## 2018-12-12 HISTORY — PX: ROBOTIC ASSISTED LAPAROSCOPIC OVARIAN CYSTECTOMY: SHX6081

## 2018-12-12 LAB — TYPE AND SCREEN
ABO/RH(D): B POS
ANTIBODY SCREEN: NEGATIVE

## 2018-12-12 LAB — POCT PREGNANCY, URINE: PREG TEST UR: NEGATIVE

## 2018-12-12 LAB — CBC
HCT: 38.9 % (ref 36.0–46.0)
HEMOGLOBIN: 12.4 g/dL (ref 12.0–15.0)
MCH: 32.1 pg (ref 26.0–34.0)
MCHC: 31.9 g/dL (ref 30.0–36.0)
MCV: 100.8 fL — ABNORMAL HIGH (ref 80.0–100.0)
Platelets: 302 10*3/uL (ref 150–400)
RBC: 3.86 MIL/uL — ABNORMAL LOW (ref 3.87–5.11)
RDW: 12.6 % (ref 11.5–15.5)
WBC: 6.5 10*3/uL (ref 4.0–10.5)
nRBC: 0 % (ref 0.0–0.2)

## 2018-12-12 LAB — ABO/RH: ABO/RH(D): B POS

## 2018-12-12 SURGERY — EXCISION, CYST, OVARY, ROBOT-ASSISTED, LAPAROSCOPIC
Anesthesia: General | Laterality: Bilateral

## 2018-12-12 MED ORDER — SUGAMMADEX SODIUM 200 MG/2ML IV SOLN
INTRAVENOUS | Status: AC
  Filled 2018-12-12: qty 2

## 2018-12-12 MED ORDER — SUGAMMADEX SODIUM 200 MG/2ML IV SOLN
INTRAVENOUS | Status: DC | PRN
  Administered 2018-12-12: 200 mg via INTRAVENOUS

## 2018-12-12 MED ORDER — KETOROLAC TROMETHAMINE 30 MG/ML IJ SOLN
30.0000 mg | Freq: Once | INTRAMUSCULAR | Status: DC | PRN
  Filled 2018-12-12: qty 1

## 2018-12-12 MED ORDER — KETOROLAC TROMETHAMINE 30 MG/ML IJ SOLN
INTRAMUSCULAR | Status: DC | PRN
  Administered 2018-12-12: 30 mg via INTRAVENOUS

## 2018-12-12 MED ORDER — ROCURONIUM BROMIDE 10 MG/ML (PF) SYRINGE
PREFILLED_SYRINGE | INTRAVENOUS | Status: DC | PRN
  Administered 2018-12-12: 50 mg via INTRAVENOUS
  Administered 2018-12-12: 10 mg via INTRAVENOUS

## 2018-12-12 MED ORDER — HYDROMORPHONE HCL 1 MG/ML IJ SOLN
INTRAMUSCULAR | Status: AC
  Filled 2018-12-12: qty 1

## 2018-12-12 MED ORDER — LACTATED RINGERS IV SOLN
INTRAVENOUS | Status: DC
  Administered 2018-12-12: 07:00:00 via INTRAVENOUS
  Filled 2018-12-12: qty 1000

## 2018-12-12 MED ORDER — SCOPOLAMINE 1 MG/3DAYS TD PT72
MEDICATED_PATCH | TRANSDERMAL | Status: AC
  Filled 2018-12-12: qty 1

## 2018-12-12 MED ORDER — PROPOFOL 10 MG/ML IV BOLUS
INTRAVENOUS | Status: AC
  Filled 2018-12-12: qty 20

## 2018-12-12 MED ORDER — MIDAZOLAM HCL 2 MG/2ML IJ SOLN
INTRAMUSCULAR | Status: DC | PRN
  Administered 2018-12-12: 2 mg via INTRAVENOUS

## 2018-12-12 MED ORDER — FENTANYL CITRATE (PF) 100 MCG/2ML IJ SOLN
INTRAMUSCULAR | Status: DC | PRN
  Administered 2018-12-12 (×2): 50 ug via INTRAVENOUS
  Administered 2018-12-12: 100 ug via INTRAVENOUS
  Administered 2018-12-12: 50 ug via INTRAVENOUS

## 2018-12-12 MED ORDER — OXYCODONE-ACETAMINOPHEN 7.5-325 MG PO TABS: 1.0000 | ORAL_TABLET | Freq: Four times a day (QID) | ORAL | 0 refills | Status: DC | PRN

## 2018-12-12 MED ORDER — CEFAZOLIN SODIUM-DEXTROSE 2-4 GM/100ML-% IV SOLN
2.0000 g | INTRAVENOUS | Status: AC
  Administered 2018-12-12: 2 g via INTRAVENOUS
  Filled 2018-12-12: qty 100

## 2018-12-12 MED ORDER — ONDANSETRON HCL 4 MG/2ML IJ SOLN
INTRAMUSCULAR | Status: DC | PRN
  Administered 2018-12-12: 4 mg via INTRAVENOUS

## 2018-12-12 MED ORDER — HYDROMORPHONE HCL 1 MG/ML IJ SOLN
0.2500 mg | INTRAMUSCULAR | Status: DC | PRN
  Administered 2018-12-12 (×2): 0.25 mg via INTRAVENOUS
  Filled 2018-12-12: qty 0.5

## 2018-12-12 MED ORDER — SCOPOLAMINE 1 MG/3DAYS TD PT72
MEDICATED_PATCH | TRANSDERMAL | Status: DC | PRN
  Administered 2018-12-12: 1 via TRANSDERMAL

## 2018-12-12 MED ORDER — DEXAMETHASONE SODIUM PHOSPHATE 10 MG/ML IJ SOLN
INTRAMUSCULAR | Status: AC
  Filled 2018-12-12: qty 1

## 2018-12-12 MED ORDER — CEFAZOLIN SODIUM-DEXTROSE 2-4 GM/100ML-% IV SOLN
INTRAVENOUS | Status: AC
  Filled 2018-12-12: qty 100

## 2018-12-12 MED ORDER — LACTATED RINGERS IV SOLN
INTRAVENOUS | Status: DC
  Administered 2018-12-12 (×2): via INTRAVENOUS
  Filled 2018-12-12: qty 1000

## 2018-12-12 MED ORDER — ACETAMINOPHEN 500 MG PO TABS
ORAL_TABLET | ORAL | Status: AC
  Filled 2018-12-12: qty 2

## 2018-12-12 MED ORDER — LIDOCAINE 2% (20 MG/ML) 5 ML SYRINGE
INTRAMUSCULAR | Status: AC
  Filled 2018-12-12: qty 5

## 2018-12-12 MED ORDER — KETOROLAC TROMETHAMINE 30 MG/ML IJ SOLN
INTRAMUSCULAR | Status: AC
  Filled 2018-12-12: qty 1

## 2018-12-12 MED ORDER — ACETAMINOPHEN 325 MG PO TABS
ORAL_TABLET | ORAL | Status: DC | PRN
  Administered 2018-12-12: 1000 mg via ORAL

## 2018-12-12 MED ORDER — PROMETHAZINE HCL 25 MG/ML IJ SOLN
6.2500 mg | INTRAMUSCULAR | Status: DC | PRN
  Filled 2018-12-12: qty 1

## 2018-12-12 MED ORDER — FENTANYL CITRATE (PF) 250 MCG/5ML IJ SOLN
INTRAMUSCULAR | Status: AC
  Filled 2018-12-12: qty 5

## 2018-12-12 MED ORDER — ARTIFICIAL TEARS OPHTHALMIC OINT
TOPICAL_OINTMENT | OPHTHALMIC | Status: AC
  Filled 2018-12-12: qty 3.5

## 2018-12-12 MED ORDER — LIDOCAINE 2% (20 MG/ML) 5 ML SYRINGE
INTRAMUSCULAR | Status: DC | PRN
  Administered 2018-12-12: 80 mg via INTRAVENOUS

## 2018-12-12 MED ORDER — BUPIVACAINE HCL (PF) 0.25 % IJ SOLN
INTRAMUSCULAR | Status: DC | PRN
  Administered 2018-12-12: 14 mL

## 2018-12-12 MED ORDER — ONDANSETRON HCL 4 MG/2ML IJ SOLN
INTRAMUSCULAR | Status: AC
  Filled 2018-12-12: qty 2

## 2018-12-12 MED ORDER — ROCURONIUM BROMIDE 100 MG/10ML IV SOLN
INTRAVENOUS | Status: AC
  Filled 2018-12-12: qty 1

## 2018-12-12 MED ORDER — DEXAMETHASONE SODIUM PHOSPHATE 10 MG/ML IJ SOLN
INTRAMUSCULAR | Status: DC | PRN
  Administered 2018-12-12: 10 mg via INTRAVENOUS

## 2018-12-12 MED ORDER — MIDAZOLAM HCL 2 MG/2ML IJ SOLN
INTRAMUSCULAR | Status: AC
  Filled 2018-12-12: qty 2

## 2018-12-12 MED ORDER — PROPOFOL 10 MG/ML IV BOLUS
INTRAVENOUS | Status: DC | PRN
  Administered 2018-12-12: 150 mg via INTRAVENOUS

## 2018-12-12 SURGICAL SUPPLY — 61 items
BARRIER ADHS 3X4 INTERCEED (GAUZE/BANDAGES/DRESSINGS) IMPLANT
CANISTER SUCT 3000ML PPV (MISCELLANEOUS) ×2 IMPLANT
CATH FOLEY 2WAY SLVR  5CC 14FR (CATHETERS) ×1
CATH FOLEY 2WAY SLVR 5CC 14FR (CATHETERS) ×1 IMPLANT
CATH FOLEY 3WAY  5CC 16FR (CATHETERS) ×1
CATH FOLEY 3WAY 5CC 16FR (CATHETERS) ×1 IMPLANT
COVER BACK TABLE 60X90IN (DRAPES) ×2 IMPLANT
COVER TIP SHEARS 8 DVNC (MISCELLANEOUS) ×1 IMPLANT
COVER TIP SHEARS 8MM DA VINCI (MISCELLANEOUS) ×1
DECANTER SPIKE VIAL GLASS SM (MISCELLANEOUS) ×4 IMPLANT
DEFOGGER SCOPE WARMER CLEARIFY (MISCELLANEOUS) ×2 IMPLANT
DERMABOND ADVANCED (GAUZE/BANDAGES/DRESSINGS) ×1
DERMABOND ADVANCED .7 DNX12 (GAUZE/BANDAGES/DRESSINGS) ×1 IMPLANT
DRAPE ARM DVNC X/XI (DISPOSABLE) ×4 IMPLANT
DRAPE COLUMN DVNC XI (DISPOSABLE) ×1 IMPLANT
DRAPE DA VINCI XI ARM (DISPOSABLE) ×4
DRAPE DA VINCI XI COLUMN (DISPOSABLE) ×1
DURAPREP 26ML APPLICATOR (WOUND CARE) ×2 IMPLANT
ELECT REM PT RETURN 9FT ADLT (ELECTROSURGICAL) ×2
ELECTRODE REM PT RTRN 9FT ADLT (ELECTROSURGICAL) ×1 IMPLANT
GAUZE PETROLATUM 1 X8 (GAUZE/BANDAGES/DRESSINGS) ×2 IMPLANT
GLOVE BIO SURGEON STRL SZ 6.5 (GLOVE) ×6 IMPLANT
GLOVE BIOGEL PI IND STRL 7.0 (GLOVE) ×5 IMPLANT
GLOVE BIOGEL PI INDICATOR 7.0 (GLOVE) ×5
IRRIG SUCT STRYKERFLOW 2 WTIP (MISCELLANEOUS) ×2
IRRIGATION SUCT STRKRFLW 2 WTP (MISCELLANEOUS) ×1 IMPLANT
LEGGING LITHOTOMY PAIR STRL (DRAPES) ×2 IMPLANT
OBTURATOR OPTICAL STANDARD 8MM (TROCAR) ×1
OBTURATOR OPTICAL STND 8 DVNC (TROCAR) ×1
OBTURATOR OPTICALSTD 8 DVNC (TROCAR) ×1 IMPLANT
OCCLUDER COLPOPNEUMO (BALLOONS) ×2 IMPLANT
PACK ROBOT WH (CUSTOM PROCEDURE TRAY) ×2 IMPLANT
PACK ROBOTIC GOWN (GOWN DISPOSABLE) ×2 IMPLANT
PACK TRENDGUARD 450 HYBRID PRO (MISCELLANEOUS) IMPLANT
PAD PREP 24X48 CUFFED NSTRL (MISCELLANEOUS) ×2 IMPLANT
POUCH ENDO CATCH II 15MM (MISCELLANEOUS) IMPLANT
PROTECTOR NERVE ULNAR (MISCELLANEOUS) ×4 IMPLANT
RTRCTR WOUND ALEXIS 18CM SML (INSTRUMENTS)
SAVER CELL AAL HAEMONETICS (INSTRUMENTS) IMPLANT
SEAL CANN UNIV 5-8 DVNC XI (MISCELLANEOUS) ×4 IMPLANT
SEAL XI 5MM-8MM UNIVERSAL (MISCELLANEOUS) ×4
SET CYSTO W/LG BORE CLAMP LF (SET/KITS/TRAYS/PACK) IMPLANT
SET TRI-LUMEN FLTR TB AIRSEAL (TUBING) ×2 IMPLANT
SUT ETHIBOND 0 (SUTURE) IMPLANT
SUT VIC AB 4-0 PS2 27 (SUTURE) ×6 IMPLANT
SUT VICRYL 0 UR6 27IN ABS (SUTURE) ×2 IMPLANT
SUT VLOC 180 0 9IN  GS21 (SUTURE) ×1
SUT VLOC 180 0 9IN GS21 (SUTURE) ×1 IMPLANT
SUT VLOC 180 2-0 6IN GS21 (SUTURE) IMPLANT
SYS RETRIEVAL 5MM INZII UNIV (BASKET) ×2
SYSTEM RETRIEVL 5MM INZII UNIV (BASKET) ×1 IMPLANT
TIP RUMI ORANGE 6.7MMX12CM (TIP) IMPLANT
TIP UTERINE 5.1X6CM LAV DISP (MISCELLANEOUS) IMPLANT
TIP UTERINE 6.7X10CM GRN DISP (MISCELLANEOUS) IMPLANT
TIP UTERINE 6.7X6CM WHT DISP (MISCELLANEOUS) IMPLANT
TIP UTERINE 6.7X8CM BLUE DISP (MISCELLANEOUS) ×2 IMPLANT
TOWEL OR 17X26 10 PK STRL BLUE (TOWEL DISPOSABLE) ×4 IMPLANT
TRENDGUARD 450 HYBRID PRO PACK (MISCELLANEOUS)
TROCAR HASSON GELL 12X100 (TROCAR) IMPLANT
TROCAR PORT AIRSEAL 5X120 (TROCAR) ×2 IMPLANT
WATER STERILE IRR 1000ML POUR (IV SOLUTION) ×2 IMPLANT

## 2018-12-12 NOTE — Transfer of Care (Signed)
Immediate Anesthesia Transfer of Care Note  Patient: Lori Collier  Procedure(s) Performed: XI ROBOTIC ASSISTED LAPAROSCOPIC OVARIAN CYSTECTOMY, CONSERVATIVE TREATMENT OF ENDOMETRIOSIS (Bilateral )  Patient Location: PACU  Anesthesia Type:General  Level of Consciousness: awake, alert , oriented and patient cooperative  Airway & Oxygen Therapy: Patient Spontanous Breathing and Patient connected to nasal cannula oxygen  Post-op Assessment: Report given to RN and Post -op Vital signs reviewed and stable  Post vital signs: Reviewed and stable  Last Vitals:  Vitals Value Taken Time  BP 129/74 12/12/2018  9:25 AM  Temp    Pulse 90 12/12/2018  9:28 AM  Resp 15 12/12/2018  9:28 AM  SpO2 100 % 12/12/2018  9:28 AM  Vitals shown include unvalidated device data.  Last Pain:  Vitals:   12/12/18 0529  TempSrc: Oral         Complications: No apparent anesthesia complications

## 2018-12-12 NOTE — Discharge Instructions (Addendum)
Ovarian Cystectomy, Care After °This sheet gives you information about how to care for yourself after your procedure. Your health care provider may also give you more specific instructions. If you have problems or questions, contact your health care provider. °What can I expect after the procedure? °After the procedure, it is common to have: °· Pain in your abdomen, especially at the incision areas. You will be given pain medicines to control the pain. °· Tiredness. This is a normal part of the recovery process. Your energy level will return to normal over the next several weeks. °· Problems passing stool (constipation). °Follow these instructions at home: °Medicines °· Take over-the-counter and prescription medicines only as told by your health care provider. °· If you were prescribed an antibiotic medicine, use it as told by your health care provider. Do not stop using the antibiotic even if you start to feel better. °· Do not take aspirin because it can cause bleeding. °· Do not drink alcohol while taking prescription pain medicine. °· Do not drive or use heavy machinery while taking prescription pain medicine. °Incision care ° °· Follow instructions from your health care provider about how to take care of your incisions. Make sure you: °? Wash your hands with soap and water before you change your bandage (dressing). If soap and water are not available, use hand sanitizer. °? Change your dressing as told by your health care provider. °? Leave stitches (sutures), skin glue, or adhesive strips in place. These skin closures may need to stay in place for 2 weeks or longer. If adhesive strip edges start to loosen and curl up, you may trim the loose edges. Do not remove adhesive strips completely unless your health care provider tells you to do that. °· Check your incision areas every day for signs of infection. Check for: °? Redness, swelling, or pain. °? Fluid or blood. °? Warmth. °? Pus or a bad smell. °· Do not  take baths, swim, or use a hot tub until your health care provider approves. Take showers instead of baths. °Activity °· Return to your normal activities and diet as told by your health care provider. Ask your health care provider what activities are safe for you. °· Take rest breaks during the day as needed. °· Do not drive until your health care provider approves. °General instructions °· Do not douche, use tampons, or have sexual intercourse until your health care provider says it is okay to do so. °· To prevent or treat constipation while you are taking prescription pain medicine, your health care provider may recommend that you: °? Take over-the-counter or prescription medicines. °? Eat foods that are high in fiber, such as fresh fruits and vegetables, whole grains, and beans. °? Drink enough fluid to keep your urine clear or pale yellow. °? Limit foods that are high in fat and processed sugars, such as fried and sweet foods. °· Keep all follow-up visits as told by your health care provider. This is important. °Contact a health care provider if: °· You have a fever. °· You feel nauseous or you vomit. °· You have pain when you urinate or have blood in your urine. °· You have a rash on your body. °· You have pain or redness where the IV was inserted. °· You have pain that is not relieved with medicine. °· You have signs of infection, such as: °? Redness, swelling, or pain around your incisions. °? Fluid or blood coming from your incisions. °? An incision that   feels warm to the touch. ? Pus or a bad smell coming from your incisions. Get help right away if:  You have chest pain or shortness of breath.  You feel dizzy or light-headed.  You have increasing abdominal pain that is not relieved with medicines.  You have pain, swelling, or redness in your leg.  Your incision is opening (the edges are not staying together). Summary  After the procedure, it is common to have some pain in your abdomen. You  will be given pain medicines to control the pain.  Follow instructions from your health care provider about how to take care of your incisions.  Do not douche, use tampons, or have sexual intercourse until your health care provider says it is okay to do so.  Keep all follow-up visits as told by your health care provider. This is important. This information is not intended to replace advice given to you by your health care provider. Make sure you discuss any questions you have with your health care provider. Document Released: 07/19/2013 Document Revised: 11/17/2016 Document Reviewed: 11/17/2016 Elsevier Interactive Patient Education  2019 [facility] Care Instructions  **You may begin taking Ibuprofen at 3:00pm and Tylenol at 1:00 pm today Activity: Get plenty of rest for the remainder of the day. A responsible adult should stay with you for 24 hours following the procedure.  For the next 24 hours, DO NOT: -Drive a car -Paediatric nurse -Drink alcoholic beverages -Take any medication unless instructed by your physician -Make any legal decisions or sign important papers.  Meals: Start with liquid foods such as gelatin or soup. Progress to regular foods as tolerated. Avoid greasy, spicy, heavy foods. If nausea and/or vomiting occur, drink only clear liquids until the nausea and/or vomiting subsides. Call your physician if vomiting continues.  Special Instructions/Symptoms: Your throat may feel dry or sore from the anesthesia or the breathing tube placed in your throat during surgery. If this causes discomfort, gargle with warm salt water. The discomfort should disappear within 24 hours.  If you had a scopolamine patch placed behind your ear for the management of post- operative nausea and/or vomiting:  1. The medication in the patch is effective for 72 hours, after which it should be removed.  Wrap patch in a tissue and discard in the trash. Wash hands  thoroughly with soap and water. 2. You may remove the patch earlier than 72 hours if you experience unpleasant side effects which may include dry mouth, dizziness or visual disturbances. 3. Avoid touching the patch. Wash your hands with soap and water after contact with the patch.

## 2018-12-12 NOTE — Anesthesia Postprocedure Evaluation (Signed)
Anesthesia Post Note  Patient: Lori Collier  Procedure(s) Performed: XI ROBOTIC ASSISTED LAPAROSCOPIC OVARIAN CYSTECTOMY, CONSERVATIVE TREATMENT OF ENDOMETRIOSIS (Bilateral )     Patient location during evaluation: PACU Anesthesia Type: General Level of consciousness: awake and alert Pain management: pain level controlled Vital Signs Assessment: post-procedure vital signs reviewed and stable Respiratory status: spontaneous breathing, nonlabored ventilation, respiratory function stable and patient connected to nasal cannula oxygen Cardiovascular status: blood pressure returned to baseline and stable Postop Assessment: no apparent nausea or vomiting Anesthetic complications: no    Last Vitals:  Vitals:   12/12/18 0945 12/12/18 1000  BP: 133/81 121/81  Pulse: 68 68  Resp: 15 16  Temp:    SpO2: 100% 100%    Last Pain:  Vitals:   12/12/18 0930  TempSrc:   PainSc: 4                  Chalmer Zheng S

## 2018-12-12 NOTE — Op Note (Signed)
Operative Note  12/12/2018  9:33 AM  PATIENT:  Lori Collier  28 y.o. female  PRE-OPERATIVE DIAGNOSIS:  Bilateral ovarian cysts consistent with endometriomas with pelvic pain  POST-OPERATIVE DIAGNOSIS:  Bilateral ovarian cysts consistent with endometriomas with pelvic pain, Severe Pelvic Endometriosis  PROCEDURE:  Procedure(s): XI ROBOTIC ASSISTED LAPAROSCOPIC BILATERAL OVARIAN CYSTECTOMY, CONSERVATIVE TREATMENT OF ENDOMETRIOSIS BY CAUTERIZATION   SURGEON:  Surgeon(s): [name], MD  ANESTHESIA:   general  FINDINGS: Severe pelvic endometriosis with bilateral large ovarian Endometriomas and superficial endometriosis on the ovaries, bilateral ovarian fossae and in the posterior cul-de-sac.  DESCRIPTION OF OPERATION: Under general anesthesia with endotracheal intubation the patient is in lithotomy.  She is prepped with DuraPrep on the abdomen and with Betadine on the suprapubic, vulvar and vaginal areas.  The Foley is inserted in the bladder.  The patient is draped as usual.  The vaginal exam reveals an anteverted uterus normal volume mobile, bilateral large ovarian cyst are felt.  A weighted speculum is inserted in the vagina.  The anterior lip of the cervix is grasped with a tenaculum.  Hysterometry is at 8 cm.  #8 Rumi with the small Koh ring are put in place.  The other instruments are removed.  Abdominally, we infiltrated the supraumbilical area with Marcaine one quarter plain.  A 1.5 cm incision is made with a scalpel using the Allises to raise the abdominal wall.  Coker's were used to grasp the aponeurosis and the aponeurosis is open with Mayo scissors under direct vision.  The parietal peritoneum is opened bluntly with the finger.  A pursestring stitch of Vicryl 0 was done on the aponeurosis.  The Sheryle Hail is inserted at that level under direct vision and a peritoneum is created with CO2.  Inspection of the abdominal pelvic cavities revealed no adhesions with the anterior wall of the  abdomen or pelvis.  Patient is status post appendectomy.  The uterus is completely normal and size and appearance.  Both tubes are completely normal in appearance with healthy fimbriated bilaterally.  Both ovaries present large cysts with superficial lesions of endometriosis on the ovaries bilaterally.  Superficial lesions of endometriosis are also present in the posterior cul-de-sac and in both ovarian fossae.  No adhesions in the abdomen or pelvis.  We infiltrate Marcaine one quarter plain at each port site.  We make small incisions with a scalpel.  We inserted 3 robotic ports, 2 on the right side and one on the distal left side, all under direct vision.  We inserted a 5 mm assistant port on the left medial aspect of the abdomen, under direct vision.  The patient is positioned in deep Trendelenburg.  The robot is docked and camera targeting is done.  The robotic instruments are inserted under direct vision with the fenestrated clamp in the fourth arm, the scissors in the third arm, the camera in the second arm and the PK bipolar in the first arm.  We go to the console.      Both ureters are seen with good peristalsis and normal anatomy position.  Pictures are taken of the right gutter and of all the pelvic organs.  We start on the right ovary by cauterizing and cutting a line opposite the mesosalpinx.  We extracted the ovarian cyst wall with traction and countertraction using the scissors and bipolar cauterization as needed.  We proceeded exactly the same way on the left ovary.  We irrigated and suction the pelvic cavity.  We then held both ovarian cyst  walls with the assistant clamp.  We cauterized the superficial lesions of endometriosis in the posterior cutis sac as well as at both ovarian fossae and on the ovaries.  We were careful not to catheterize any lesion close to the ureters.  We then completed hemostasis on both ovarian beds where the ovarian cysts were removed.  Hemostasis was adequate at all levels.   We irrigated and suction once more.  Pictures were taken.  We confirmed good peristalsis of both ureters.  All robotic instruments were removed under direct vision.  The robot was undocked.  We went to laparoscopy time.      We used a 5 mm Endobag through the assistant ports after switching the specimens of bilateral ovarian cyst walls to a robotic port.  The 2 specimens of ovarian cyst walls were inserted in the bag and removed from the abdominal pelvic cavities.  They were sent to pathology.  All laparoscopic instruments were removed.  The ports were removed under direct vision.  The CO2 was evacuated.  The supraumbilical incision was closed by attaching the pursestring stitch at the aponeurosis.  We used the Bovie to complete hemostasis at the incisions.  We then closed all incision with separate stitches of Vicryl 4-0.  Dermabond was added on all incisions.  Hemostasis was adequate.  The Koh ring and Rumi were removed from the vagina/cervix.  The Foley was removed.  The patient was brought to recovery room in good and stable status.  ESTIMATED BLOOD LOSS: 25 mL   Intake/Output Summary (Last 24 hours) at 12/12/2018 0933 Last data filed at 12/12/2018 6283 Gross per 24 hour  Intake 800 ml  Output 125 ml  Net 675 ml     BLOOD ADMINISTERED:none   LOCAL MEDICATIONS USED:  MARCAINE     SPECIMEN:  Source of Specimen:  Bilateral ovarian cyst walls  DISPOSITION OF SPECIMEN:  PATHOLOGY  COUNTS:  YES  PLAN OF CARE: Transfer to PACU  Marie-Lyne LavoieMD9:33 AM

## 2018-12-12 NOTE — Anesthesia Procedure Notes (Signed)
Procedure Name: Intubation Date/Time: 12/12/2018 7:43 AM Performed by: Wanita Chamberlain, CRNA Pre-anesthesia Checklist: Patient identified, Emergency Drugs available, Suction available, Timeout performed and Patient being monitored Patient Re-evaluated:Patient Re-evaluated prior to induction Oxygen Delivery Method: Circle system utilized Preoxygenation: Pre-oxygenation with 100% oxygen Induction Type: IV induction Ventilation: Mask ventilation without difficulty Laryngoscope Size: Mac and 3 Grade View: Grade I Tube type: Oral Number of attempts: 1 Airway Equipment and Method: Stylet Placement Confirmation: ETT inserted through vocal cords under direct vision,  positive ETCO2,  CO2 detector and breath sounds checked- equal and bilateral Secured at: 21 cm Tube secured with: Tape Dental Injury: Teeth and Oropharynx as per pre-operative assessment

## 2018-12-12 NOTE — H&P (Addendum)
Lori Collier is an 28 y.o. female. G0 Single  RP: Persistent painful bilateral Ovarian Cysts for Robotic Bilateral Ovarian Cystectomy, possible lysis of adhesions  HPI:  No change since last evaluation in office on 11/18/2018.  Pelvic pain persistent, but tolerable.  Pelvic US 02/2018 and 10/2018 showing stable persistent Rt and Lt Ovarian Cysts c/w an Endometriomas.  Menses normal every 3-4 weeks.  Currently abstinent.  Pertinent Gynecological History: Menses: Regular normal Contraception: abstinence Blood transfusions: none Sexually transmitted diseases: None Previous GYN Procedures: None Last mammogram: Never Last pap: Normal OB History: G0   Menstrual History: Patient's last menstrual period was 12/11/2018.    History reviewed. No pertinent past medical history.  Past Surgical History:  Procedure Laterality Date  . APPENDECTOMY  09/2010    Family History  Problem Relation Age of Onset  . Hypertension Mother   . Thyroid disease Mother     Social History:  reports that she has never smoked. She has never used smokeless tobacco. She reports current alcohol use. She reports that she does not use drugs.  Allergies: No Known Allergies  Medications Prior to Admission  Medication Sig Dispense Refill Last Dose  . loratadine (CLARITIN) 10 MG tablet Take 10 mg by mouth daily.   12/11/2018 at Unknown time  . mometasone-formoterol (DULERA) 100-5 MCG/ACT AERO Inhale 2 puffs into the lungs daily.   Past Month at Unknown time  . norgestimate-ethinyl estradiol (ORTHO-CYCLEN,SPRINTEC,PREVIFEM) 0.25-35 MG-MCG tablet Take 1 tablet by mouth daily. 3 Package 4 Past Week at Unknown time  . norgestimate-ethinyl estradiol (ORTHO-CYCLEN,SPRINTEC,PREVIFEM) 0.25-35 MG-MCG tablet Take 1 tablet by mouth daily.       REVIEW OF SYSTEMS: A ROS was performed and pertinent positives and negatives are included in the history.  GENERAL: No fevers or chills. HEENT: No change in vision, no earache, sore  throat or sinus congestion. NECK: No pain or stiffness. CARDIOVASCULAR: No chest pain or pressure. No palpitations. PULMONARY: No shortness of breath, cough or wheeze. GASTROINTESTINAL: No abdominal pain, nausea, vomiting or diarrhea, melena or bright red blood per rectum. GENITOURINARY: No urinary frequency, urgency, hesitancy or dysuria. MUSCULOSKELETAL: No joint or muscle pain, no back pain, no recent trauma. DERMATOLOGIC: No rash, no itching, no lesions. ENDOCRINE: No polyuria, polydipsia, no heat or cold intolerance. No recent change in weight. HEMATOLOGICAL: No anemia or easy bruising or bleeding. NEUROLOGIC: No headache, seizures, numbness, tingling or weakness. PSYCHIATRIC: No depression, no loss of interest in normal activity or change in sleep pattern.     Blood pressure 111/77, pulse 85, temperature 98 F (36.7 C), temperature source Oral, resp. rate 16, height 5\' 5"  (1.651 m), weight 54.8 kg, last menstrual period 12/11/2018, SpO2 100 %.  Physical Exam:  See office notes   Results for orders placed or performed during the hospital encounter of 12/12/18 (from the past 24 hour(s))  Pregnancy, urine POC     Status: None   Collection Time: 12/12/18  5:57 AM  Result Value Ref Range   Preg Test, Ur NEGATIVE NEGATIVE  CBC     Status: Abnormal   Collection Time: 12/12/18  6:25 AM  Result Value Ref Range   WBC 6.5 4.0 - 10.5 K/uL   RBC 3.86 (L) 3.87 - 5.11 MIL/uL   Hemoglobin 12.4 12.0 - 15.0 g/dL   HCT 38.9 36.0 - 46.0 %   MCV 100.8 (H) 80.0 - 100.0 fL   MCH 32.1 26.0 - 34.0 pg   MCHC 31.9 30.0 - 36.0 g/dL  RDW 12.6 11.5 - 15.5 %   Platelets 302 150 - 400 K/uL   nRBC 0.0 0.0 - 0.2 %   Pelvic US 10/27/2018:  T/V images. Uterus normal size and shape homogeneous measuring 7.89 x 4.57 x 3.22 cm. Endometrial lining symmetrical and normal measuring 7.7 mm. Bilateral avascular ovarian cysts with debris's. Left ovarian cyst measuring 6.9 x 5.4 x 4.2 cm (stable compared to last pelvic  ultrasound in May 2019). Right ovarian cyst measuring 7.9 x 7.3 x 6.0 cm (slightly larger compared to last pelvic ultrasound in May 2019).Ovarian masses are mobile and nontender. No free fluid in the posterior cul-de-sac.   Assessment/Plan:  28 y.o. G0  1. Bilateral ovarian cysts Persistent bilateral ovarian cysts compatible with endometriomas measuring 6.9 cm on the left ovary and 7.9 cm on the right ovary.  Persistent pelvic pain unchanged since last visit.  Decision to proceed with a laparoscopy could bilateral ovarian cystectomy with conservative treatment of endometriosis.  Patient has a history of previous ruptured appendix with laparoscopic appendectomy.  Preop management, procedure and postop management reviewed thoroughly with patient.  Surgical risks discussed including the risk of trauma, the risk of infection, the risk of DVT/pulmonary embolism and the anesthesia risk.  Patient voiced understanding and agreement with plan.                         Patient was counseled as to the risk of surgery to include the following:  1. Infection (prohylactic antibiotics will be administered)  2. DVT/Pulmonary Embolism (prophylactic pneumo compression stockings will be used)  3.Trauma to internal organs requiring additional surgical procedure to repair any injury to internal organs requiring perhaps additional hospitalization days.  4.Hemmorhage requiring transfusion and blood products which carry risks such as anaphylactic reaction, hepatitis and AIDS  Patient had received literature information on the procedure scheduled and all her questions were answered and fully accepts all risk.   Lori Collier 12/12/2018, 6:53 AM

## 2018-12-12 NOTE — Anesthesia Preprocedure Evaluation (Addendum)
Anesthesia Evaluation  Patient identified by MRN, date of birth, ID band Patient awake    Reviewed: Allergy & Precautions, NPO status , Patient's Chart, lab work & pertinent test results  Airway Mallampati: II  TM Distance: >3 FB Neck ROM: Full    Dental no notable dental hx. (+) Teeth Intact, Dental Advisory Given   Pulmonary neg pulmonary ROS,    Pulmonary exam normal breath sounds clear to auscultation       Cardiovascular negative cardio ROS Normal cardiovascular exam Rhythm:Regular Rate:Normal     Neuro/Psych negative neurological ROS  negative psych ROS   GI/Hepatic negative GI ROS, Neg liver ROS,   Endo/Other  negative endocrine ROS  Renal/GU negative Renal ROS  negative genitourinary   Musculoskeletal negative musculoskeletal ROS (+)   Abdominal   Peds negative pediatric ROS (+)  Hematology negative hematology ROS (+)   Anesthesia Other Findings   Reproductive/Obstetrics negative OB ROS                            Anesthesia Physical Anesthesia Plan  ASA: II  Anesthesia Plan: General   Post-op Pain Management:    Induction: Intravenous  PONV Risk Score and Plan: 4 or greater and Ondansetron, Dexamethasone, Midazolam and Scopolamine patch - Pre-op  Airway Management Planned: Oral ETT  Additional Equipment:   Intra-op Plan:   Post-operative Plan: Extubation in OR  Informed Consent: I have reviewed the patients History and Physical, chart, labs and discussed the procedure including the risks, benefits and alternatives for the proposed anesthesia with the patient or authorized representative who has indicated his/her understanding and acceptance.     Dental advisory given  Plan Discussed with: CRNA and Surgeon  Anesthesia Plan Comments:         Anesthesia Quick Evaluation

## 2018-12-12 NOTE — Addendum Note (Signed)
Addendum  created 12/12/18 1255 by Wanita Chamberlain, CRNA   Clinical Note Signed

## 2018-12-13 ENCOUNTER — Encounter (HOSPITAL_BASED_OUTPATIENT_CLINIC_OR_DEPARTMENT_OTHER): Payer: Self-pay | Admitting: Obstetrics & Gynecology

## 2018-12-19 ENCOUNTER — Telehealth: Payer: Self-pay | Admitting: *Deleted

## 2018-12-19 MED ORDER — NORGESTIMATE-ETH ESTRADIOL 0.25-35 MG-MCG PO TABS: 1.0000 | ORAL_TABLET | Freq: Every day | ORAL | 0 refills | Status: DC

## 2018-12-19 NOTE — Telephone Encounter (Signed)
Patient called asking if sprintec could be sent to new pharmacy Rx sent.

## 2018-12-28 ENCOUNTER — Telehealth: Payer: Self-pay | Admitting: *Deleted

## 2018-12-28 NOTE — Telephone Encounter (Signed)
Patient post removal of bilateral Ovarian Cysts, has post op scheduled on 01/03/19. Called today noticed brownish drainage for incision site, very itching at site as well. She is cleaning site with antibacterial soap and using alcohol after bathing at site. No blood, doesn't appear infected per patient . I told her this all sound normal and we will see next week for post op. If you agree.

## 2018-12-30 ENCOUNTER — Ambulatory Visit (INDEPENDENT_AMBULATORY_CARE_PROVIDER_SITE_OTHER): Payer: BLUE CROSS/BLUE SHIELD | Admitting: Obstetrics & Gynecology

## 2018-12-30 ENCOUNTER — Encounter: Payer: Self-pay | Admitting: Obstetrics & Gynecology

## 2018-12-30 ENCOUNTER — Other Ambulatory Visit: Payer: Self-pay

## 2018-12-30 VITALS — BP 104/68

## 2018-12-30 DIAGNOSIS — N809 Endometriosis, unspecified: Secondary | ICD-10-CM

## 2018-12-30 DIAGNOSIS — Z09 Encounter for follow-up examination after completed treatment for conditions other than malignant neoplasm: Secondary | ICD-10-CM

## 2018-12-30 MED ORDER — MEDROXYPROGESTERONE ACETATE 150 MG/ML IM SUSP
150.0000 mg | Freq: Once | INTRAMUSCULAR | Status: AC
  Administered 2018-12-30: 150 mg via INTRAMUSCULAR

## 2018-12-30 MED ORDER — MEDROXYPROGESTERONE ACETATE 150 MG/ML IM SUSP: 150.0000 mg | INTRAMUSCULAR | 4 refills | Status: DC

## 2018-12-30 NOTE — Progress Notes (Signed)
    Lori Collier March 24, 1991 [id number]        27 y.o.  G0 Single  RP: Postop Robotic Bilateral Ovarian Cystectomies for Endometriosis  HPI: Very good postop progression with no abdominopelvic pain currently.  No abnormal vaginal discharge.  No fever.  Incisions healing well.   OB History  Gravida Para Term Preterm AB Living  0 0 0 0 0 0  SAB TAB Ectopic Multiple Live Births  0 0 0 0 0    Past medical history,surgical history, problem list, medications, allergies, family history and social history were all reviewed and documented in the EPIC chart.   Directed ROS with pertinent positives and negatives documented in the history of present illness/assessment and plan.  Exam:  Vitals:   12/30/18 0948  BP: 104/68   General appearance:  Normal  Abdomen: Normal, soft, nondistended.  Incisions well closed.  Gynecologic exam: Vulva normal.  Bimanual exam: Uterus anteverted, normal volume, mobile, nontender.  No adnexal mass felt, nontender bilaterally.  Pathology 12/12/2018: Diagnosis Ovaries, bilateral excision, endometriomas - ENDOMETRIOTIC CYST(S).   Assessment/Plan:  28 y.o. G0P0000   1. Status post gynecological surgery, follow-up exam Very good postop evolution and progression.  No complication.  Can resume all physical activities and sexual activities.  2. Severe endometriosis Diagnosis of severe endometriosis discussed with counseling on treatment to decrease the risk of recurrence of ovarian endometriomas, to control the disease to reduce pain, and to improve long-term outcomes especially in terms of fertility.  After discussing all possible treatments for endometriosis including Depo-Lupron, [name], Depo-Provera and birth control pills, decision to start with Depo-Provera at this time.  May have to give injections more closely than every 3 months if patient has persistent breakthrough bleeding as she had in the past on Depo-Provera.  Other orders -  medroxyPROGESTERone (DEPO-PROVERA) 150 MG/ML injection; Inject 1 mL (150 mg total) into the muscle every 3 (three) months.  Counseling on above issues and coordination of care more than 50% for 15 minutes.  [name] MD, 9:58 AM 12/30/2018

## 2019-01-01 ENCOUNTER — Encounter: Payer: Self-pay | Admitting: Obstetrics & Gynecology

## 2019-01-01 NOTE — Patient Instructions (Signed)
1. Status post gynecological surgery, follow-up exam Very good postop evolution and progression.  No complication.  Can resume all physical activities and sexual activities.  2. Severe endometriosis Diagnosis of severe endometriosis discussed with counseling on treatment to decrease the risk of recurrence of ovarian endometriomas, to control the disease to reduce pain, and to improve long-term outcomes especially in terms of fertility.  After discussing all possible treatments for endometriosis including Depo-Lupron, [name], Depo-Provera and birth control pills, decision to start with Depo-Provera at this time.  May have to give injections more closely than every 3 months if patient has persistent breakthrough bleeding as she had in the past on Depo-Provera.  Other orders - medroxyPROGESTERone (DEPO-PROVERA) 150 MG/ML injection; Inject 1 mL (150 mg total) into the muscle every 3 (three) months.  Lori Collier, it was a pleasure seeing you today!

## 2019-01-03 ENCOUNTER — Ambulatory Visit: Payer: BLUE CROSS/BLUE SHIELD | Admitting: Obstetrics & Gynecology

## 2019-01-05 ENCOUNTER — Telehealth: Payer: Self-pay | Admitting: *Deleted

## 2019-01-05 MED ORDER — CLOBETASOL PROPIONATE 0.05 % EX OINT: 1.0000 "application " | TOPICAL_OINTMENT | Freq: Two times a day (BID) | CUTANEOUS | 1 refills | Status: DC

## 2019-01-05 NOTE — Telephone Encounter (Signed)
Please send Clobetasol 0.05% ointment thin application to affected areas BID x 7 days 30 g refill x 1.  Stop working to take Benadryl at home as needed.  Avoid contact with anything else on affected skin.

## 2019-01-05 NOTE — Telephone Encounter (Signed)
Please see the below, patient called back said the hives are around incision site ( appears to be getting better) but hives have now spread on breast, hips and groin area. States the hydrocortisone was to help with the itching, she is working and not able to keep taking benadryl because it makes her sleepy. Still having itching at incision site as well. Please advise

## 2019-01-05 NOTE — Telephone Encounter (Signed)
Patient aware, Rx sent.  

## 2019-01-05 NOTE — Telephone Encounter (Signed)
Patient had post-op visit on 12/30/18 states you told her she could use hydrocortisone cream on incision site. Has noticed hives outbreak, taking benadryl.  Left message for pt to call discuss.

## 2019-01-18 ENCOUNTER — Other Ambulatory Visit: Payer: Self-pay

## 2019-01-19 ENCOUNTER — Encounter: Payer: Self-pay | Admitting: Obstetrics & Gynecology

## 2019-01-19 ENCOUNTER — Ambulatory Visit (INDEPENDENT_AMBULATORY_CARE_PROVIDER_SITE_OTHER): Payer: BLUE CROSS/BLUE SHIELD | Admitting: Obstetrics & Gynecology

## 2019-01-19 VITALS — BP 116/70

## 2019-01-19 DIAGNOSIS — T8149XA Infection following a procedure, other surgical site, initial encounter: Secondary | ICD-10-CM | POA: Diagnosis not present

## 2019-01-19 MED ORDER — CEPHALEXIN 500 MG PO CAPS: 500.0000 mg | ORAL_CAPSULE | Freq: Two times a day (BID) | ORAL | 0 refills | Status: AC

## 2019-01-19 NOTE — Progress Notes (Signed)
    Lori Collier Apr 26, 1991 [id number]        28 y.o.  G0 Single  RP: Infection of incisions with drainage  HPI: Robotic surgery for bilateral ovarian cystectomies and treatment of endometriosis on December 12, 2018.  Good postop evolution with no pelvic pain and no vaginal bleeding, but mild discharge from the 2 to left small incisions and the supraumbilical incision.  Urine and bowel movements normal.  No fever.   OB History  Gravida Para Term Preterm AB Living  0 0 0 0 0 0  SAB TAB Ectopic Multiple Live Births  0 0 0 0 0    Past medical history,surgical history, problem list, medications, allergies, family history and social history were all reviewed and documented in the EPIC chart.   Directed ROS with pertinent positives and negatives documented in the history of present illness/assessment and plan.  Exam:  Vitals:   01/19/19 0929  BP: 116/70   General appearance:  Normal  Abdomen: Left incisions and supraumbilical incisions with mild drainage, clear.  No erythema.  No skin dehiscence.    Gynecologic exam: Deferred   Assessment/Plan:  28 y.o. G0P0000   1. Incisional infection Mild superficial skin incision infection.  Keep incisions clean.  Will treat with Keflex 500 mg PO BID x 7 days.  Usage reviewed and prescription sent to pharmacy.    Other orders - cephALEXin (KEFLEX) 500 MG capsule; Take 1 capsule (500 mg total) by mouth 2 (two) times daily for 7 days.  Counseling on above issues and coordination of care >50% x 15 minutes.  [name] MD, 9:40 AM 01/19/2019

## 2019-01-19 NOTE — Patient Instructions (Signed)
1. Incisional infection Mild superficial skin incision infection.  Keep incisions clean.  Will treat with Keflex 500 mg PO BID x 7 days.  Usage reviewed and prescription sent to pharmacy.    Other orders - cephALEXin (KEFLEX) 500 MG capsule; Take 1 capsule (500 mg total) by mouth 2 (two) times daily for 7 days.  Libby, it was a pleasure seeing you today!

## 2019-02-13 ENCOUNTER — Telehealth: Payer: Self-pay | Admitting: *Deleted

## 2019-02-13 NOTE — Telephone Encounter (Signed)
Come in now for an early DepoProvera injection.

## 2019-02-13 NOTE — Telephone Encounter (Signed)
Patient states you told her to call if bleeding continued with depo provera, on day 25 of bleeding, last injection on 12/30/18.  Please advise

## 2019-02-14 NOTE — Telephone Encounter (Signed)
Left detailed message on cell.

## 2019-03-02 ENCOUNTER — Telehealth: Payer: Self-pay | Admitting: *Deleted

## 2019-03-02 NOTE — Telephone Encounter (Signed)
Patient called c/o groin pressure, states she had this feeling before. No drainage, no urinary symptoms, no itching or vaginal discharge also concerned about the spotting, was informed to come in for early depo,but has not yet done so. I did recommend OV, but due to COVID-19 patient declined, I offered Video visit she was fine with this, transferred to appointment desk to schedule.

## 2019-03-07 ENCOUNTER — Ambulatory Visit: Payer: BLUE CROSS/BLUE SHIELD | Admitting: Obstetrics & Gynecology

## 2019-03-07 ENCOUNTER — Other Ambulatory Visit: Payer: Self-pay

## 2019-03-07 ENCOUNTER — Encounter: Payer: Self-pay | Admitting: Obstetrics & Gynecology

## 2019-03-07 VITALS — BP 128/82

## 2019-03-07 DIAGNOSIS — Z30011 Encounter for initial prescription of contraceptive pills: Secondary | ICD-10-CM

## 2019-03-07 DIAGNOSIS — Z113 Encounter for screening for infections with a predominantly sexual mode of transmission: Secondary | ICD-10-CM | POA: Diagnosis not present

## 2019-03-07 DIAGNOSIS — N921 Excessive and frequent menstruation with irregular cycle: Secondary | ICD-10-CM

## 2019-03-07 DIAGNOSIS — N809 Endometriosis, unspecified: Secondary | ICD-10-CM

## 2019-03-07 MED ORDER — MEDROXYPROGESTERONE ACETATE 150 MG/ML IM SUSP
150.0000 mg | Freq: Once | INTRAMUSCULAR | Status: AC
  Administered 2019-03-07: 150 mg via INTRAMUSCULAR

## 2019-03-07 MED ORDER — NORETHIN ACE-ETH ESTRAD-FE 1-20 MG-MCG(24) PO TABS: 1.0000 | ORAL_TABLET | Freq: Every day | ORAL | 1 refills | Status: DC

## 2019-03-07 NOTE — Patient Instructions (Signed)
1. Severe endometriosis Treating with DepoProvera, first dose received January 07, 2019.  Frequent breakthrough bleeding.  Decision to give an early second dose today.  2. Breakthrough bleeding on depo provera Early 2nd injection of DepoProvera today.  Will add a low Estrogen/Progestin pill x 1 pack to help with BTB.  3. Encounter for initial prescription of contraceptive pills Lomedia 24 Fe 1/20 x 1 pack to control breakthrough bleeding.  No contraindication to birth control pills.  Usage reviewed and prescription sent to pharmacy.  4. Screen for STD (sexually transmitted disease) No evidence of PID on exam.  Strict condom use recommended. - HIV antibody (with reflex) - RPR - Hepatitis C Antibody - Hepatitis B Surface AntiGEN - C. trachomatis/N. gonorrhoeae RNA  Other orders - medroxyPROGESTERone (DEPO-PROVERA) injection 150 mg - Norethindrone Acetate-Ethinyl Estrad-FE (LOESTRIN 24 FE) 1-20 MG-MCG(24) tablet; Take 1 tablet by mouth daily.  Lori Collier, it was a pleasure seeing you today!  I will inform you of your results as soon as they are available.

## 2019-03-07 NOTE — Progress Notes (Signed)
    Lori Collier 1990/12/17 [id number]        27 y.o.  G0 Single.    RP: BTB on DepoProvera/pelvic pressure/possible exposure to STIs  HPI: H/O severe Endometriosis Dx confirmed by LPS with bilaterat ovarian cystectomy of endometriomas on 12/12/2018. On DepoProvera with 1st injection on 12/30/2018.  Frequent BTB since 1st injection of DepoProvera.  Pelvic pressure on-off.  Broke up with boyfriend a week ago as he was cheating.  Had IC with him 2 weeks ago without condoms.  No abnormal vaginal discharge.  No fever.   OB History  Gravida Para Term Preterm AB Living  0 0 0 0 0 0  SAB TAB Ectopic Multiple Live Births  0 0 0 0 0    Past medical history,surgical history, problem list, medications, allergies, family history and social history were all reviewed and documented in the EPIC chart.   Directed ROS with pertinent positives and negatives documented in the history of present illness/assessment and plan.  Exam:  Vitals:   03/07/19 1618  BP: 128/82   General appearance:  Normal  Abdomen: Normal  Gynecologic exam:    Assessment/Plan:  28 y.o. G0P0000   1. Severe endometriosis Treating with DepoProvera, first dose received January 07, 2019.  Frequent breakthrough bleeding.  Decision to give an early second dose today.  2. Breakthrough bleeding on depo provera Early 2nd injection of DepoProvera today.  Will add a low Estrogen/Progestin pill x 1 pack to help with BTB.  3. Encounter for initial prescription of contraceptive pills Lomedia 24 Fe 1/20 x 1 pack to control breakthrough bleeding.  No contraindication to birth control pills.  Usage reviewed and prescription sent to pharmacy.  4. Screen for STD (sexually transmitted disease) No evidence of PID on exam.  Strict condom use recommended. - HIV antibody (with reflex) - RPR - Hepatitis C Antibody - Hepatitis B Surface AntiGEN - C. trachomatis/N. gonorrhoeae RNA  Other orders - medroxyPROGESTERone (DEPO-PROVERA)  injection 150 mg - Norethindrone Acetate-Ethinyl Estrad-FE (LOESTRIN 24 FE) 1-20 MG-MCG(24) tablet; Take 1 tablet by mouth daily.  Counseling on above issues and coordination of care more than 50% for 25 minutes.  [name] MD, 4:30 PM 03/07/2019

## 2019-03-08 LAB — C. TRACHOMATIS/N. GONORRHOEAE RNA
C. trachomatis RNA, TMA: NOT DETECTED
N. gonorrhoeae RNA, TMA: NOT DETECTED

## 2019-03-08 LAB — HEPATITIS C ANTIBODY
Hepatitis C Ab: NONREACTIVE
SIGNAL TO CUT-OFF: 0.01 (ref <1.00)

## 2019-03-08 LAB — RPR: RPR Ser Ql: NONREACTIVE

## 2019-03-08 LAB — HEPATITIS B SURFACE ANTIGEN: Hepatitis B Surface Ag: NONREACTIVE

## 2019-03-08 LAB — HIV ANTIBODY (ROUTINE TESTING W REFLEX): HIV 1&2 Ab, 4th Generation: NONREACTIVE

## 2019-03-17 ENCOUNTER — Ambulatory Visit: Payer: BC Managed Care – PPO

## 2019-05-02 ENCOUNTER — Telehealth: Payer: BLUE CROSS/BLUE SHIELD | Admitting: Family

## 2019-05-02 DIAGNOSIS — Z20822 Contact with and (suspected) exposure to covid-19: Secondary | ICD-10-CM

## 2019-05-02 MED ORDER — PROMETHAZINE-DM 6.25-15 MG/5ML PO SYRP: 5.0000 mL | ORAL_SOLUTION | Freq: Four times a day (QID) | ORAL | 0 refills | Status: DC | PRN

## 2019-05-02 NOTE — Progress Notes (Signed)
E-Visit for Corona Virus Screening   Your current symptoms could be consistent with the coronavirus.  Many health care providers can now test patients at their office but not all are.  [facility] has multiple testing sites. For information on our COVID testing locations and hours go to [email or web address]  Please quarantine yourself while awaiting your test results.  We are enrolling you in our [facility] . Daily you will receive a questionnaire within the [facility] website. Our COVID 19 response team willl be monitoriing your responses daily.  You may set up COVID testing through the website above.   COVID-19 is a respiratory illness with symptoms that are similar to the flu. Symptoms are typically mild to moderate, but there have been cases of severe illness and death due to the virus. The following symptoms may appear 2-14 days after exposure: . Fever . Cough . Shortness of breath or difficulty breathing . Chills . Repeated shaking with chills . Muscle pain . Headache . Sore throat . New loss of taste or smell . Fatigue . Congestion or runny nose . Nausea or vomiting . Diarrhea  It is vitally important that if you feel that you have an infection such as this virus or any other virus that you stay home and away from places where you may spread it to others.  You should self-quarantine for 14 days if you have symptoms that could potentially be coronavirus or have been in close contact a with a person diagnosed with COVID-19 within the last 2 weeks. You should avoid contact with people age 28 and older.   You should wear a mask or cloth face covering over your nose and mouth if you must be around other people or animals, including pets (even at home). Try to stay at least 6 feet away from other people. This will protect the people around you.   You may also take acetaminophen (Tylenol) as needed for fever.   Reduce your risk of any  infection by using the same precautions used for avoiding the common cold or flu:  Marland Kitchen Wash your hands often with soap and warm water for at least 20 seconds.  If soap and water are not readily available, use an alcohol-based hand sanitizer with at least 60% alcohol.  . If coughing or sneezing, cover your mouth and nose by coughing or sneezing into the elbow areas of your shirt or coat, into a tissue or into your sleeve (not your hands). . Avoid shaking hands with others and consider head nods or verbal greetings only. . Avoid touching your eyes, nose, or mouth with unwashed hands.  . Avoid close contact with people who are sick. . Avoid places or events with large numbers of people in one location, like concerts or sporting events. . Carefully consider travel plans you have or are making. . If you are planning any travel outside or inside the [place], visit the CDC's Travelers' Health webpage for the latest health notices. . If you have some symptoms but not all symptoms, continue to monitor at home and seek medical attention if your symptoms worsen. . If you are having a medical emergency, call 911.  HOME CARE . Only take medications as instructed by your medical team. . Drink plenty of fluids and get plenty of rest. . A steam or ultrasonic humidifier can help if you have congestion.   GET HELP RIGHT AWAY IF YOU HAVE EMERGENCY WARNING SIGNS** FOR COVID-19. If you or someone is  showing any of these signs seek emergency medical care immediately. Call 911 or proceed to your closest emergency facility if: . You develop worsening high fever. . Trouble breathing . Bluish lips or face . Persistent pain or pressure in the chest . New confusion . Inability to wake or stay awake . You cough up blood. . Your symptoms become more severe  **This list is not all possible symptoms. Contact your medical provider for any symptoms that are sever or concerning to you.   MAKE SURE YOU   Understand these  instructions.  Will watch your condition.  Will get help right away if you are not doing well or get worse.  Your e-visit answers were reviewed by a board certified advanced clinical practitioner to complete your personal care plan.  Depending on the condition, your plan could have included both over the counter or prescription medications.  If there is a problem please reply once you have received a response from your provider.  Your safety is important to [place].  If you have drug allergies check your prescription carefully.    You can use MyChart to ask questions about today's visit, request a non-urgent call back, or ask for a work or school excuse for 24 hours related to this e-Visit. If it has been greater than 24 hours you will need to follow up with your provider, or enter a new e-Visit to address those concerns. You will get an e-mail in the next two days asking about your experience.  I hope that your e-visit has been valuable and will speed your recovery. Thank you for using e-visits.   Greater than 5 minutes, yet less than 10 minutes of time have been spent researching, coordinating, and implementing care for this patient today.  Thank you for the details you included in the comment boxes. Those details are very helpful in determining the best course of treatment for you and help [place] to provide the best care.

## 2019-05-03 ENCOUNTER — Other Ambulatory Visit: Payer: Self-pay

## 2019-05-03 DIAGNOSIS — Z20822 Contact with and (suspected) exposure to covid-19: Secondary | ICD-10-CM

## 2019-05-05 LAB — NOVEL CORONAVIRUS, NAA: SARS-CoV-2, NAA: NOT DETECTED

## 2019-05-23 ENCOUNTER — Ambulatory Visit: Payer: BC Managed Care – PPO | Admitting: Obstetrics & Gynecology

## 2019-05-26 ENCOUNTER — Other Ambulatory Visit: Payer: Self-pay

## 2019-05-29 ENCOUNTER — Other Ambulatory Visit: Payer: Self-pay

## 2019-05-29 ENCOUNTER — Ambulatory Visit: Payer: BC Managed Care – PPO | Admitting: Obstetrics & Gynecology

## 2019-05-29 ENCOUNTER — Encounter: Payer: Self-pay | Admitting: Obstetrics & Gynecology

## 2019-05-29 VITALS — BP 110/70

## 2019-05-29 DIAGNOSIS — N921 Excessive and frequent menstruation with irregular cycle: Secondary | ICD-10-CM

## 2019-05-29 DIAGNOSIS — N809 Endometriosis, unspecified: Secondary | ICD-10-CM | POA: Diagnosis not present

## 2019-05-29 MED ORDER — MEDROXYPROGESTERONE ACETATE 150 MG/ML IM SUSP
150.0000 mg | Freq: Once | INTRAMUSCULAR | Status: AC
  Administered 2019-05-29: 150 mg via INTRAMUSCULAR

## 2019-05-29 MED ORDER — NORETHIN ACE-ETH ESTRAD-FE 1-20 MG-MCG(24) PO TABS: 1.0000 | ORAL_TABLET | Freq: Every day | ORAL | 1 refills | Status: DC

## 2019-05-29 NOTE — Progress Notes (Signed)
    Lori Collier 09/11/1991 [id number]        27 y.o.  G0  Single  RP: BTB on DepoProvera for severe Endometriosis  HPI: Breakthrough bleeding frequently since first injection of Depo-Provera in 12/2018.  Took a low-dose birth control pill Janel 1/20 with no bleeding during that time, but bleeding came back when stopped.  Currently just mild spotting.  No pelvic pain.  No side effect of Depo-Provera otherwise.   OB History  Gravida Para Term Preterm AB Living  0 0 0 0 0 0  SAB TAB Ectopic Multiple Live Births  0 0 0 0 0    Past medical history,surgical history, problem list, medications, allergies, family history and social history were all reviewed and documented in the EPIC chart.   Directed ROS with pertinent positives and negatives documented in the history of present illness/assessment and plan.  Exam:  Vitals:   05/29/19 1044  BP: 110/70   General appearance:  Normal  Gynecologic exam: Deferred   Assessment/Plan:  28 y.o. G0  1. Severe endometriosis Confirmed by operative LPS 12/2018.  Treating with DepoProvera, 1st dose 12/2018.  3rd DepoProvera injection given today.  2. Breakthrough bleeding on depo provera BTB on DepoProvera after 2 injections.  Decision to proceed with the 3rd injection today.  No side effects otherwise.  Prescribed 1 pack of LoEstrin 24 Fe 1/20 to control BTB as needed.  Other orders - amphetamine-dextroamphetamine (ADDERALL) 10 MG tablet; Take 10 mg by mouth daily with breakfast. - Norethindrone Acetate-Ethinyl Estrad-FE (LOESTRIN 24 FE) 1-20 MG-MCG(24) tablet; Take 1 tablet by mouth daily.  Counseling on above issues and coordination of care more than 50% for 25 minutes.  [name] MD, 11:09 AM 05/29/2019

## 2019-05-30 ENCOUNTER — Encounter: Payer: Self-pay | Admitting: Obstetrics & Gynecology

## 2019-05-30 NOTE — Patient Instructions (Signed)
1. Severe endometriosis Confirmed by operative LPS 12/2018.  Treating with DepoProvera, 1st dose 12/2018.  3rd DepoProvera injection given today.  2. Breakthrough bleeding on depo provera BTB on DepoProvera after 2 injections.  Decision to proceed with the 3rd injection today.  No side effects otherwise.  Prescribed 1 pack of LoEstrin 24 Fe 1/20 to control BTB as needed.  Other orders - amphetamine-dextroamphetamine (ADDERALL) 10 MG tablet; Take 10 mg by mouth daily with breakfast. - Norethindrone Acetate-Ethinyl Estrad-FE (LOESTRIN 24 FE) 1-20 MG-MCG(24) tablet; Take 1 tablet by mouth daily.  Heavin, it was a pleasure seeing you today!

## 2019-08-18 ENCOUNTER — Telehealth: Payer: Self-pay

## 2019-08-18 MED ORDER — MEDROXYPROGESTERONE ACETATE 150 MG/ML IM SUSP: 150.0000 mg | INTRAMUSCULAR | 2 refills | Status: DC

## 2019-08-18 NOTE — Telephone Encounter (Addendum)
Requested D-P injection to be sent to her pharmacy. Already has her nurse visit for injection scheduled for 08/22/19.

## 2019-08-22 ENCOUNTER — Other Ambulatory Visit: Payer: Self-pay

## 2019-08-22 ENCOUNTER — Ambulatory Visit (INDEPENDENT_AMBULATORY_CARE_PROVIDER_SITE_OTHER): Payer: BLUE CROSS/BLUE SHIELD | Admitting: Anesthesiology

## 2019-08-22 DIAGNOSIS — N809 Endometriosis, unspecified: Secondary | ICD-10-CM

## 2019-08-22 MED ORDER — MEDROXYPROGESTERONE ACETATE 150 MG/ML IM SUSP
150.0000 mg | Freq: Once | INTRAMUSCULAR | Status: AC
  Administered 2019-08-22: 150 mg via INTRAMUSCULAR

## 2019-08-23 ENCOUNTER — Emergency Department (HOSPITAL_COMMUNITY): Payer: Self-pay

## 2019-08-23 ENCOUNTER — Other Ambulatory Visit: Payer: Self-pay

## 2019-08-23 ENCOUNTER — Emergency Department (HOSPITAL_COMMUNITY)
Admission: EM | Admit: 2019-08-23 | Discharge: 2019-08-23 | Disposition: A | Discharge status: Home / Self Care | Payer: Self-pay | Attending: Emergency Medicine | Admitting: Emergency Medicine

## 2019-08-23 DIAGNOSIS — R569 Unspecified convulsions: Secondary | ICD-10-CM

## 2019-08-23 DIAGNOSIS — Z8709 Personal history of other diseases of the respiratory system: Secondary | ICD-10-CM | POA: Insufficient documentation

## 2019-08-23 DIAGNOSIS — Z79899 Other long term (current) drug therapy: Secondary | ICD-10-CM | POA: Insufficient documentation

## 2019-08-23 DIAGNOSIS — F909 Attention-deficit hyperactivity disorder, unspecified type: Secondary | ICD-10-CM | POA: Insufficient documentation

## 2019-08-23 LAB — URINALYSIS, ROUTINE W REFLEX MICROSCOPIC
Bilirubin Urine: NEGATIVE
Glucose, UA: NEGATIVE mg/dL
Ketones, ur: NEGATIVE mg/dL
Nitrite: NEGATIVE
Protein, ur: NEGATIVE mg/dL
Specific Gravity, Urine: 1.025 (ref 1.005–1.030)
WBC, UA: >50 WBC/hpf — ABNORMAL HIGH (ref 0–5)
pH: 6 (ref 5.0–8.0)

## 2019-08-23 LAB — BASIC METABOLIC PANEL
Anion gap: 8 (ref 5–15)
BUN: 15 mg/dL (ref 6–20)
CO2: 24 mmol/L (ref 22–32)
Calcium: 9.3 mg/dL (ref 8.9–10.3)
Chloride: 107 mmol/L (ref 98–111)
Creatinine, Ser: 0.77 mg/dL (ref 0.44–1.00)
GFR calc Af Amer: >60 mL/min (ref >60)
GFR calc non Af Amer: >60 mL/min (ref >60)
Glucose, Bld: 100 mg/dL — ABNORMAL HIGH (ref 70–99)
Potassium: 3.9 mmol/L (ref 3.5–5.1)
Sodium: 139 mmol/L (ref 135–145)

## 2019-08-23 LAB — I-STAT BETA HCG BLOOD, ED (MC, WL, AP ONLY): I-stat hCG, quantitative: <5 m[IU]/mL (ref <5)

## 2019-08-23 LAB — RAPID URINE DRUG SCREEN, HOSP PERFORMED
Amphetamines: NOT DETECTED
Barbiturates: NOT DETECTED
Benzodiazepines: NOT DETECTED
Cocaine: NOT DETECTED
Opiates: NOT DETECTED
Tetrahydrocannabinol: NOT DETECTED

## 2019-08-23 LAB — CBC
HCT: 42.4 % (ref 36.0–46.0)
Hemoglobin: 14.6 g/dL (ref 12.0–15.0)
MCH: 33 pg (ref 26.0–34.0)
MCHC: 34.4 g/dL (ref 30.0–36.0)
MCV: 95.9 fL (ref 80.0–100.0)
Platelets: 304 10*3/uL (ref 150–400)
RBC: 4.42 MIL/uL (ref 3.87–5.11)
RDW: 12 % (ref 11.5–15.5)
WBC: 5.3 10*3/uL (ref 4.0–10.5)
nRBC: 0 % (ref 0.0–0.2)

## 2019-08-23 MED ORDER — LEVETIRACETAM 500 MG PO TABS
500.0000 mg | ORAL_TABLET | Freq: Once | ORAL | Status: AC
  Administered 2019-08-23: 500 mg via ORAL
  Filled 2019-08-23: qty 1

## 2019-08-23 MED ORDER — LEVETIRACETAM IN NACL 1000 MG/100ML IV SOLN: 1000.0000 mg | Freq: Once | INTRAVENOUS | Status: DC

## 2019-08-23 MED ORDER — LEVETIRACETAM 500 MG PO TABS: 500.0000 mg | ORAL_TABLET | Freq: Two times a day (BID) | ORAL | 0 refills | Status: DC

## 2019-08-23 NOTE — Discharge Instructions (Addendum)
You were seen today for new seizures. Your labs and imaging did not show evidence for infection, stroke, or tumor. You will be started on a new medication to help prevent future seizures. You should ask your primary doctor about if you can come off of adderall as this can lower your seizure threshold. You will be following up with Neurology outpatient for further workup.   Per [facility] statutes, patients with seizures are not allowed to drive until  they have been seizure-free for six months. Use caution when using heavy equipment or power tools. Avoid working on ladders or at heights. Take showers instead of baths. Ensure the water temperature is not too high on the home water heater. Do not go swimming alone. When caring for infants or small children, sit down when holding, feeding, or changing them to minimize risk of injury to the child in the event you have a seizure.

## 2019-08-23 NOTE — ED Provider Notes (Signed)
[facility] EMERGENCY DEPARTMENT Provider Note   CSN: [id number] Arrival date & time: 08/23/19  1319     History   Chief Complaint Chief Complaint  Patient presents with  . Seizures    HPI Lori Collier is a 28 y.o. female with PMH of endometriosis, asthma, ADHD who presents after seizure-like activity that occurred early this morning around 2am. She was sleeping at the time, episode was witnessed by significant other who reports whole body shaking with her tongue hanging out and drooling. Patient was confused after the event and went back to sleep. Similar episode occurred around 10/18, also while patient was sleeping. Patient and significant other deny any bowel or bladder incontinence. Prior to 10/18 this had never occurred before. No FH of seizures. Patient vapes and smokes marijuana some. Reports daily alcohol use, about 2 beers or a glass of wine per day. Last drink last night. Never had problems with withdrawal in the past. Denies recent illnesses, fever, chills, chest pain, difficulties breathing, sick contacts. Denies recent medication changes or head trauma.    No past medical history on file.  There are no active problems to display for this patient.   Past Surgical History:  Procedure Laterality Date  . APPENDECTOMY  09/2010  . ROBOTIC ASSISTED LAPAROSCOPIC OVARIAN CYSTECTOMY Bilateral 12/12/2018   Procedure: XI ROBOTIC ASSISTED LAPAROSCOPIC OVARIAN CYSTECTOMY, CONSERVATIVE TREATMENT OF ENDOMETRIOSIS;  Surgeon: [name], MD;  Location: [facility];  Service: Gynecology;  Laterality: Bilateral;     OB History    Gravida  0   Para  0   Term  0   Preterm  0   AB  0   Living  0     SAB  0   TAB  0   Ectopic  0   Multiple  0   Live Births  0           Home Medications    Prior to Admission medications   Medication Sig Start Date End Date Taking? Authorizing Provider  albuterol (VENTOLIN HFA) 108 (90  Base) MCG/ACT inhaler Inhale 1 puff into the lungs every 4 (four) hours as needed for shortness of breath. 05/24/19  Yes [provider]  amphetamine-dextroamphetamine (ADDERALL XR) 10 MG 24 hr capsule Take 10 mg by mouth every morning. 07/11/19  Yes [provider]  fluticasone (FLONASE) 50 MCG/ACT nasal spray Place 1 spray into both nostrils daily as needed for allergies or rhinitis.   Yes [provider]  ibuprofen (ADVIL) 200 MG tablet Take 400 mg by mouth every 6 (six) hours as needed for moderate pain.   Yes [provider]  loratadine (CLARITIN) 10 MG tablet Take 10 mg by mouth daily as needed for allergies.    Yes [provider]  medroxyPROGESTERone (DEPO-PROVERA) 150 MG/ML injection Inject 1 mL (150 mg total) into the muscle every 3 (three) months. 08/18/19  Yes [name], MD  naproxen sodium (ALEVE) 220 MG tablet Take 660 mg by mouth as needed (pain).   Yes [provider]  Norethindrone Acetate-Ethinyl Estrad-FE (LOESTRIN 24 FE) 1-20 MG-MCG(24) tablet Take 1 tablet by mouth daily. Patient taking differently: Take 1 tablet by mouth as needed (prolonged periods).  05/29/19  Yes [name], MD  levETIRAcetam (KEPPRA) 500 MG tablet Take 1 tablet (500 mg total) by mouth 2 (two) times daily. 08/23/19   Rory Percy, DO    Family History Family History  Problem Relation Age of Onset  .  Hypertension Mother   . Thyroid disease Mother     Social History Social History   Tobacco Use  . Smoking status: Never Smoker  . Smokeless tobacco: Never Used  Substance Use Topics  . Alcohol use: Yes    Comment: occ  . Drug use: No    Allergies   Patient has no known allergies.   Review of Systems Review of Systems  Constitutional: Positive for fatigue. Negative for chills and fever.  Eyes: Negative for visual disturbance.  Respiratory: Negative for shortness of breath.   Cardiovascular: Negative for chest pain.   Gastrointestinal: Negative for nausea and vomiting.  Genitourinary: Positive for frequency. Negative for dysuria.  Neurological: Positive for headaches.     Physical Exam Updated Vital Signs BP 96/61 (BP Location: Right Arm)   Pulse 69   Temp 97.8 F (36.6 C) (Oral)   Resp 12   LMP 08/22/2019 (Exact Date)   SpO2 98%   Physical Exam Constitutional:      General: She is not in acute distress.    Appearance: Normal appearance. She is not ill-appearing, toxic-appearing or diaphoretic.  HENT:     Head: Normocephalic and atraumatic.     Right Ear: External ear normal.     Left Ear: External ear normal.     Mouth/Throat:     Mouth: Mucous membranes are moist.     Pharynx: Oropharynx is clear.  Eyes:     General: No visual field deficit.    Extraocular Movements: Extraocular movements intact.     Pupils: Pupils are equal, round, and reactive to light.  Cardiovascular:     Rate and Rhythm: Normal rate and regular rhythm.     Heart sounds: Normal heart sounds. No murmur.  Pulmonary:     Effort: No respiratory distress.     Breath sounds: Normal breath sounds. No wheezing, rhonchi or rales.  Abdominal:     General: Bowel sounds are normal.     Palpations: Abdomen is soft.     Tenderness: There is no abdominal tenderness. There is no guarding.  Neurological:     Mental Status: She is alert.     Cranial Nerves: Cranial nerves are intact. No facial asymmetry.     Sensory: Sensation is intact.     Motor: Motor function is intact. No weakness or pronator drift.     Coordination: Coordination is intact. Finger-Nose-Finger Test normal.     ED Treatments / Results  Labs (all labs ordered are listed, but only abnormal results are displayed) Labs Reviewed  BASIC METABOLIC PANEL - Abnormal; Notable for the following components:      Result Value   Glucose, Bld 100 (*)    All other components within normal limits  URINALYSIS, ROUTINE W REFLEX MICROSCOPIC - Abnormal; Notable for the  following components:   APPearance HAZY (*)    Hgb urine dipstick MODERATE (*)    Leukocytes,Ua SMALL (*)    WBC, UA >50 (*)    Bacteria, UA FEW (*)    All other components within normal limits  CBC  RAPID URINE DRUG SCREEN, HOSP PERFORMED  I-STAT BETA HCG BLOOD, ED ([facility], [facility], [facility] ONLY)  CBG MONITORING, ED    EKG None  Radiology Ct Head Wo Contrast  Result Date: 08/23/2019 CLINICAL DATA:  Seizure. EXAM: CT HEAD WITHOUT CONTRAST TECHNIQUE: Contiguous axial images were obtained from the base of the skull through the vertex without intravenous contrast. COMPARISON:  None. FINDINGS: Brain: No evidence of acute infarction,  hemorrhage, hydrocephalus, extra-axial collection or mass lesion/mass effect. Vascular: No hyperdense vessel or unexpected calcification. Skull: Normal. Negative for fracture or focal lesion. Sinuses/Orbits: Left maxillary mucous retention cyst is noted. Other: None. IMPRESSION: No acute intracranial abnormality seen. Electronically Signed   By: Marijo Conception M.D.   On: 08/23/2019 15:17    Procedures Procedures (including critical care time)  Medications Ordered in ED Medications  levETIRAcetam (KEPPRA) tablet 500 mg (500 mg Oral Given 08/23/19 2228)    Initial Impression / Assessment and Plan / ED Course  I have reviewed the triage vital signs and the nursing notes.  Pertinent labs & imaging results that were available during my care of the patient were reviewed by me and considered in my medical decision making (see chart for details).   28yo F with h/o asthma, ADHD, endometriosis who presents with 2 prior episodes of whole body shaking with tongue biting and confusion concerning for possible new onset unprovoked seizure activity. Vitals wnl and neurologically intact on exam without noted confusion. CT head negative for intracranial abnormalities. CBC, BMP do not show evidence for infection or electrolyte abnormalities. With daily alcohol use, some concern for  withdrawal though she does not appear to be in acute withdrawal as she is well appearing without tachycardia, nausea, diaphoresis. UDS negative. Spoke with neurology on-call who recommended starting keppra given this is her second occurrence and consider stopping adderall as this is known to lower seizure threshold. Monitored in the ED without further events and remained hemodynamically stable and neurologically intact. Stable for discharge with outpatient neurology follow up.      Final Clinical Impressions(s) / ED Diagnoses   Final diagnoses:  Seizure [facility])    ED Discharge Orders         Ordered    Ambulatory referral to Neurology    Comments: An appointment is requested in approximately: 2 weeks. New onset seizures.   08/23/19 2142    levETIRAcetam (KEPPRA) 500 MG tablet  2 times daily     08/23/19 2152           Rory Percy, DO 08/23/19 2235    Little, Wenda Overland, MD 08/23/19 2306

## 2019-08-23 NOTE — ED Triage Notes (Signed)
Pt endorses having a seizure while sleeping 10/17 and then had another seizure last night while sleeping. Both observed by significant other. VSS. No known hx of szs. Drinks a glass of wine every night with a few beers. Last drank last night. Does feel like she has issues with withdrawal.

## 2019-08-23 NOTE — ED Notes (Signed)
Culture sent down with urine.  

## 2019-09-27 ENCOUNTER — Encounter: Payer: Self-pay | Admitting: Neurology

## 2019-09-27 ENCOUNTER — Other Ambulatory Visit: Payer: Self-pay

## 2019-09-27 ENCOUNTER — Ambulatory Visit: Payer: Self-pay | Attending: Family Medicine | Admitting: Physician Assistant

## 2019-09-27 DIAGNOSIS — R569 Unspecified convulsions: Secondary | ICD-10-CM

## 2019-09-27 DIAGNOSIS — Z09 Encounter for follow-up examination after completed treatment for conditions other than malignant neoplasm: Secondary | ICD-10-CM

## 2019-09-27 MED ORDER — LEVETIRACETAM 500 MG PO TABS: 500.0000 mg | ORAL_TABLET | Freq: Two times a day (BID) | ORAL | 2 refills | Status: DC

## 2019-09-27 MED FILL — levETIRAcetam 500 MG TABS: 500 | 30 days supply | Qty: 60 | Fill #0

## 2019-09-27 NOTE — Progress Notes (Signed)
Patient ID: Lori Collier, female   DOB: 12-03-1990, 28 y.o.   MRN: [id number] Virtual Visit via Telephone Note  I connected with Lori Collier on 09/27/19 at 10:50 AM EST by telephone and verified that I am speaking with the correct person using two identifiers.   I discussed the limitations, risks, security and privacy concerns of performing an evaluation and management service by telephone and the availability of in person appointments. I also discussed with the patient that there may be a patient responsible charge related to this service. The patient expressed understanding and agreed to proceed.  Patient location:  home My Location:  [facility] office Persons on the call:  Me and the patient    History of Present Illness: Hospital follow-up after having a seizure on 08/23/2019.  Denies Benzo or alcohol use.  She had never had a seizure before.  She has been taking keppra since and has not had further seizures.  She is not driving currently until she sees a neurologist.  She does not have insurance.  She has taken adderall on and off since about age 89.  She takes it about twice a week.  No FH of seizures.  She is running out of keppra  28yo F with h/o asthma, ADHD, endometriosis who presents with 2 prior episodes of whole body shaking with tongue biting and confusion concerning for possible new onset unprovoked seizure activity. Vitals wnl and neurologically intact on exam without noted confusion. CT head negative for intracranial abnormalities. CBC, BMP do not show evidence for infection or electrolyte abnormalities. With daily alcohol use, some concern for withdrawal though she does not appear to be in acute withdrawal as she is well appearing without tachycardia, nausea, diaphoresis. UDS negative. Spoke with neurology on-call who recommended starting keppra given this is her second occurrence and consider stopping adderall as this is known to lower seizure threshold. Monitored in the ED without  further events and remained hemodynamically stable and neurologically intact. Stable for discharge with outpatient neurology follow up.      Observations/Objective:  NAD.  A&Ox3    Assessment and Plan: 1. Seizure ([place]) - levETIRAcetam (KEPPRA) 500 MG tablet; Take 1 tablet (500 mg total) by mouth 2 (two) times daily.  Dispense: 60 tablet; Refill: 2 - Ambulatory referral to Neurology  2. Encounter for examination following treatment at hospital Doing well;  Needs to come for financial packet - Ambulatory referral to Neurology   Follow Up Instructions: Assign PCP 4-6 weeks   I discussed the assessment and treatment plan with the patient. The patient was provided an opportunity to ask questions and all were answered. The patient agreed with the plan and demonstrated an understanding of the instructions.   The patient was advised to call back or seek an in-person evaluation if the symptoms worsen or if the condition fails to improve as anticipated.  I provided 13 minutes of non-face-to-face time during this encounter.   Freeman Caldron, PA-C

## 2019-10-30 MED FILL — levETIRAcetam 500 MG TABS: 500 | 30 days supply | Qty: 60 | Fill #1

## 2019-11-08 ENCOUNTER — Ambulatory Visit: Payer: Medicaid Other | Admitting: Family Medicine

## 2019-11-09 ENCOUNTER — Other Ambulatory Visit: Payer: Self-pay

## 2019-11-09 ENCOUNTER — Ambulatory Visit: Payer: Medicaid Other

## 2019-11-09 DIAGNOSIS — Z3042 Encounter for surveillance of injectable contraceptive: Secondary | ICD-10-CM

## 2019-11-09 MED ORDER — MEDROXYPROGESTERONE ACETATE 150 MG/ML IM SUSP
150.0000 mg | Freq: Once | INTRAMUSCULAR | Status: AC
  Administered 2019-11-09: 150 mg via INTRAMUSCULAR

## 2019-12-01 MED FILL — levETIRAcetam 500 MG TABS: 500 | 30 days supply | Qty: 60 | Fill #2

## 2019-12-15 ENCOUNTER — Ambulatory Visit (INDEPENDENT_AMBULATORY_CARE_PROVIDER_SITE_OTHER): Payer: Self-pay | Admitting: Neurology

## 2019-12-15 ENCOUNTER — Encounter: Payer: Self-pay | Admitting: Neurology

## 2019-12-15 ENCOUNTER — Other Ambulatory Visit: Payer: Self-pay

## 2019-12-15 VITALS — BP 137/86 | HR 117 | Ht 65.0 in | Wt 126.6 lb

## 2019-12-15 DIAGNOSIS — R569 Unspecified convulsions: Secondary | ICD-10-CM

## 2019-12-15 DIAGNOSIS — G40909 Epilepsy, unspecified, not intractable, without status epilepticus: Secondary | ICD-10-CM

## 2019-12-15 MED ORDER — LEVETIRACETAM 500 MG PO TABS: ORAL_TABLET | ORAL | 11 refills | Status: DC

## 2019-12-15 NOTE — Patient Instructions (Addendum)
1. Schedule MRI brain with and without contrast  2. Schedule 1-hour EEG  3. Increase Keppra 500mg : take 1 tab in AM, 2 tabs in PM. Monitor mood  4. Discuss bloodwork for fatigue with your PCP  5. Continue seizure diary  6. As per [place] driving laws, no driving until 6 months seizure-free. We can try applying for an exemption through the [facility], please drop off paperwork that our office will fill out and send back to the [facility] if you wish to request for this  7. Follow-up in 3 months, call for any changes.  Seizure Precautions: 1. If medication has been prescribed for you to prevent seizures, take it exactly as directed.  Do not stop taking the medicine without talking to your doctor first, even if you have not had a seizure in a long time.   2. Avoid activities in which a seizure would cause danger to yourself or to others.  Don't operate dangerous machinery, swim alone, or climb in high or dangerous places, such as on ladders, roofs, or girders.  Do not drive unless your doctor says you may.  3. If you have any warning that you may have a seizure, lay down in a safe place where you can't hurt yourself.    4.  No driving for 6 months from last seizure, as per [facility].   Please refer to the following link on the [place] website for more information: http://www.epilepsyfoundation.org/answerplace/Social/driving/drivingu.cfm   5.  Maintain good sleep hygiene. Avoid alcohol.  6.  Notify your neurology if you are planning pregnancy or if you become pregnant.  7.  Contact your doctor if you have any problems that may be related to the medicine you are taking.  8.  Call 911 and bring the patient back to the ED if:        A.  The seizure lasts longer than 5 minutes.       B.  The patient doesn't awaken shortly after the seizure  C.  The patient has new problems such as difficulty seeing, speaking or moving  D.  The patient was injured during the seizure  E.   The patient has a temperature over 102 F (39C)  F.  The patient vomited and now is having trouble breathing

## 2019-12-15 NOTE — Progress Notes (Signed)
NEUROLOGY CONSULTATION NOTE  Lori Collier MRN: [id number] DOB: September 20, 1991  Referring provider: Freeman Caldron, PA-C Primary care provider: [facility] at [facility]  Reason for consult:  seizures   Thank you for your kind referral of Lori Collier for consultation of the above symptoms. Although her history is well known to you, please allow me to reiterate it for the purpose of our medical record. She is alone in the office today. Records and images were personally reviewed where available.   HISTORY OF PRESENT ILLNESS: This is a 29 year old right-handed woman with no significant past medical history presenting for evaluation of new onset seizures. She has had 4 nocturnal seizures since October 2020, waking her boyfriend up in the middle of the night. The first seizure occurred on 07/30/19 at 2am lasting 60 seconds. Her boyfriend heard her make a weird sound, like a growl, then her body tensed up with vigorous shaking and drooling. She bit her tongue. She was very confused after, she recalls hearing what he was saying, then went back to sleep. The second seizure occurred again at 2am on 08/23/19 lasting 20 seconds. She was confused after, she could hear her boyfriend talking to her but it was like he was not speaking [place]. Her brain was slower the next day. She was brought to the ER where CBC, BMP, UDS negative. I personally reviewed head CT without contrast which was unremarkable. She was discharged home on Levetiracetam 500mg  BID. She had another possible seizure on 11/30 at 4:30am, she was breathing rapidly through her teeth with minor shaking, and was aware after but felt out of it the next day. The last seizure occurred on 10/14/19 at 3am, she reports it was very mild lasting 15-20 seconds. She had missed her LEV dose the night prior. She denies any staring/unresponsive episodes. There was one time while driving in January when she felt very tired and noted she was a lot  further down the highway. She feels she also had Covid-19 at that time. Sometimes she tastes blood but feels it is from coughing a lot. She recalls an episode 1-2 months ago at work when her whole side felt weak for 30-60 minutes. She has a lot of tingling in her hands and feet. No myoclonic jerks. She has infrequent migraine with visual aura of white light with dots. Twice she could not see anything and vomited. She denies any dizziness, diplopia, dysarthria/dysphagia, neck/back pain, bowel/bladder dysfunction. Her anxiety is pretty bad. 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 She feels her memory is unchanged, there is a little trouble but she is doing pretty well in school. She has not noticed any significant side effects on LEV, her boyfriend notes she is a little more irritable, but she has not  noticed it. She feels very tired the past couple of weeks. Due to stress at school, she has been drinking 3 beers every night. She usually gets 8 hours of sleep.   Epilepsy Risk Factors:  Her half-sister had one seizure at age 57. She had a head injury at age 50 or 22 with a small scar on the right forehead hit by a metal door, no LOC. Otherwise she had a normal birth and early development.  There is no history of febrile convulsions, CNS infections such as meningitis/encephalitis, significant traumatic brain injury, neurosurgical procedures, or family history of seizures.  PAST MEDICAL HISTORY: History reviewed. No pertinent past medical history.  PAST SURGICAL HISTORY: Past Surgical History:  Procedure Laterality Date  . APPENDECTOMY  09/2010  . ROBOTIC ASSISTED LAPAROSCOPIC OVARIAN CYSTECTOMY Bilateral 12/12/2018   Procedure: XI ROBOTIC ASSISTED LAPAROSCOPIC OVARIAN CYSTECTOMY, CONSERVATIVE TREATMENT OF ENDOMETRIOSIS;  Surgeon: [name], MD;  Location: [place];  Service: Gynecology;  Laterality: Bilateral;    MEDICATIONS: Current Outpatient  Medications on File Prior to Visit  Medication Sig Dispense Refill  . albuterol (VENTOLIN HFA) 108 (90 Base) MCG/ACT inhaler Inhale 1 puff into the lungs every 4 (four) hours as needed for shortness of breath.    Marland Kitchen b complex vitamins tablet Take 1 tablet by mouth daily.    . fluticasone (FLONASE) 50 MCG/ACT nasal spray Place 1 spray into both nostrils daily as needed for allergies or rhinitis.    Marland Kitchen ibuprofen (ADVIL) 200 MG tablet Take 400 mg by mouth every 6 (six) hours as needed for moderate pain.    Marland Kitchen levETIRAcetam (KEPPRA) 500 MG tablet Take 1 tablet (500 mg total) by mouth 2 (two) times daily. 60 tablet 2  . loratadine (CLARITIN) 10 MG tablet Take 10 mg by mouth daily as needed for allergies.     . medroxyPROGESTERone (DEPO-PROVERA) 150 MG/ML injection Inject 1 mL (150 mg total) into the muscle every 3 (three) months. 1 mL 2  . naproxen sodium (ALEVE) 220 MG tablet Take 660 mg by mouth as needed (pain).    Marland Kitchen VITAMIN D PO Take by mouth.    . Norethindrone Acetate-Ethinyl Estrad-FE (LOESTRIN 24 FE) 1-20 MG-MCG(24) tablet Take 1 tablet by mouth daily. (Patient not taking: Reported on 09/27/2019) 1 Package 1   No current facility-administered medications on file prior to visit.    ALLERGIES: Allergies  Allergen Reactions  . Natural Vegetable Orange [Psyllium]     Tingling sensation in the tongue, and itchy throat per patient    FAMILY HISTORY: Family History  Problem Relation Age of Onset  . Hypertension Mother   . Thyroid disease Mother     SOCIAL HISTORY: Social History   Socioeconomic History  . Marital status: Single    Spouse name: Not on file  . Number of children: Not on file  . Years of education: Not on file  . Highest education level: Not on file  Occupational History  . Not on file  Tobacco Use  . Smoking status: Current Some Day Smoker  . Smokeless tobacco: Never Used  Substance and Sexual Activity  . Alcohol use: Yes    Comment: occ  . Drug use: No  .  Sexual activity: Not Currently    Comment: 1st intercourse- 16, partners- 22,   Other Topics Concern  . Not on file  Social History Narrative   Right handed    Lives with boy friend    One story home    Social Determinants of Health   Financial Resource Strain:   . Difficulty of Paying Living Expenses: Not on file  Food Insecurity:   . Worried About Charity fundraiser in the Last Year: Not on file  . Ran Out of Food in the Last Year: Not on file  Transportation Needs:   . Lack of Transportation (Medical): Not on file  . Lack of Transportation (Non-Medical): Not on file  Physical Activity:   . Days of Exercise per Week: Not on file  . Minutes of Exercise per Session: Not on file  Stress:   . Feeling of Stress : Not on file  Social Connections:   .  Frequency of Communication with Friends and Family: Not on file  . Frequency of Social Gatherings with Friends and Family: Not on file  . Attends Religious Services: Not on file  . Active Member of Clubs or Organizations: Not on file  . Attends Archivist Meetings: Not on file  . Marital Status: Not on file  Intimate Partner Violence:   . Fear of Current or Ex-Partner: Not on file  . Emotionally Abused: Not on file  . Physically Abused: Not on file  . Sexually Abused: Not on file    REVIEW OF SYSTEMS: Constitutional: No fevers, chills, or sweats, no generalized fatigue, change in appetite Eyes: No visual changes, double vision, eye pain Ear, nose and throat: No hearing loss, ear pain, nasal congestion, sore throat Cardiovascular: No chest pain, palpitations Respiratory:  No shortness of breath at rest or with exertion, wheezes GastrointestinaI: No nausea, vomiting, diarrhea, abdominal pain, fecal incontinence Genitourinary:  No dysuria, urinary retention or frequency Musculoskeletal:  No neck pain, back pain Integumentary: No rash, pruritus, skin lesions Neurological: as above Psychiatric: No depression, insomnia,+  anxiety Endocrine: No palpitations, fatigue, diaphoresis, mood swings, change in appetite, change in weight, increased thirst Hematologic/Lymphatic:  No anemia, purpura, petechiae. Allergic/Immunologic: no itchy/runny eyes, nasal congestion, recent allergic reactions, rashes  PHYSICAL EXAM: Vitals:   12/15/19 1333  BP: 137/86  Pulse: (!) 117  SpO2: 99%   General: No acute distress Head:  Normocephalic/atraumatic Skin/Extremities: No rash, no edema Neurological Exam: Mental status: alert and oriented to person, place, and time, no dysarthria or aphasia, Fund of knowledge is appropriate.  Recent and remote memory are intact. 3/3 delayed recall.  Attention and concentration are normal.    Able to name objects and repeat phrases. Cranial nerves: CN I: not tested CN II: pupils equal, round and reactive to light, visual fields intact CN III, IV, VI:  full range of motion, no nystagmus, no ptosis CN V: facial sensation intact CN VII: upper and lower face symmetric CN VIII: hearing intact to conversation Bulk & Tone: normal, no fasciculations. Motor: 5/5 throughout with no pronator drift. Sensation: intact to light touch, cold, pin, vibration and joint position sense.  No extinction to double simultaneous stimulation.  Romberg test negative Deep Tendon Reflexes: +2 throughout Plantar responses: downgoing bilaterally Cerebellar: no incoordination on finger to nose testing Gait: narrow-based and steady, able to tandem walk adequately. 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 Her neurological exam is normal, etiology of seizures unclear. MRI brain with and without contrast and a 1-hour EEG will be ordered to further classify seizures. She continues to report seizures on current regimen, increase Levetiracetam 500mg : take 1 tab in AM, 2 tabs in PM, monitor mood.  Lamotrigine is another option for women of childbearing age, she has no pregnancy plans currently. [place] driving laws were discussed with the patient, and she knows to stop driving after a seizure, until 6 months seizure-free. Discuss fatigue with PCP. Follow-up in 3 months, she knows to call for any changes.   Thank you for allowing me to participate in the care of this patient. Please do not hesitate to call for any questions or concerns.   Lori Collier, M.D.  CC: Freeman Caldron, PA-C, [place]

## 2019-12-28 ENCOUNTER — Telehealth: Payer: Self-pay | Admitting: Neurology

## 2019-12-28 NOTE — Telephone Encounter (Signed)
Patient called regarding her Keppra medication(Generic). She said that she does not have insurance and has a few questions for the nurse regarding the medication. Please Call.Thank you

## 2019-12-29 NOTE — Telephone Encounter (Signed)
Pt informed that If she can wait it out another week, would do that. Otherwise can decrease back to 500mg  BID but if seizures recur, we will have to talk about different medication. Pt verbalized understanding

## 2019-12-29 NOTE — Telephone Encounter (Signed)
If she can wait it out another week, would do that. Otherwise can decrease back to 500mg  BID but if seizures recur, we will have to talk about different medication. Thanks

## 2019-12-29 NOTE — Telephone Encounter (Signed)
Pt called no answer voice mail left for pt to call back 

## 2019-12-29 NOTE — Telephone Encounter (Signed)
Spoke with pt she stated that with increase in [place] she has had an increase in anxiety as well as being tired, she stated hat she felt like this when she 1st started Keppra but it wasn't this this bad, asking if she should wait it out or decrease back down to 500 mg at night?

## 2020-02-12 ENCOUNTER — Other Ambulatory Visit: Payer: Self-pay

## 2020-02-12 ENCOUNTER — Ambulatory Visit (INDEPENDENT_AMBULATORY_CARE_PROVIDER_SITE_OTHER): Payer: Medicaid Other | Admitting: Anesthesiology

## 2020-02-12 ENCOUNTER — Other Ambulatory Visit: Payer: Medicaid Other

## 2020-02-12 DIAGNOSIS — Z3042 Encounter for surveillance of injectable contraceptive: Secondary | ICD-10-CM

## 2020-02-12 MED ORDER — MEDROXYPROGESTERONE ACETATE 150 MG/ML IM SUSP
150.0000 mg | Freq: Once | INTRAMUSCULAR | Status: AC
  Administered 2020-02-12: 150 mg via INTRAMUSCULAR

## 2020-02-12 NOTE — Addendum Note (Signed)
Addended by: Thurnell Garbe A on: 02/12/2020 04:06 PM   Modules accepted: Orders

## 2020-02-13 ENCOUNTER — Ambulatory Visit: Payer: Medicaid Other

## 2020-02-13 LAB — PREGNANCY, URINE: Preg Test, Ur: NEGATIVE

## 2020-02-13 NOTE — Addendum Note (Signed)
Addended by: Thurnell Garbe A on: 02/13/2020 09:16 AM   Modules accepted: Orders

## 2020-02-13 NOTE — Addendum Note (Signed)
Addended by: Thurnell Garbe A on: 02/13/2020 09:27 AM   Modules accepted: Orders

## 2020-03-27 ENCOUNTER — Ambulatory Visit: Payer: Medicaid Other | Admitting: Neurology

## 2020-04-30 ENCOUNTER — Ambulatory Visit (INDEPENDENT_AMBULATORY_CARE_PROVIDER_SITE_OTHER): Payer: Medicaid Other | Admitting: Anesthesiology

## 2020-04-30 ENCOUNTER — Other Ambulatory Visit: Payer: Self-pay

## 2020-04-30 DIAGNOSIS — Z3042 Encounter for surveillance of injectable contraceptive: Secondary | ICD-10-CM

## 2020-04-30 MED ORDER — MEDROXYPROGESTERONE ACETATE 150 MG/ML IM SUSP
150.0000 mg | Freq: Once | INTRAMUSCULAR | Status: AC
  Administered 2020-04-30: 150 mg via INTRAMUSCULAR

## 2020-06-24 ENCOUNTER — Telehealth: Payer: Self-pay | Admitting: Neurology

## 2020-06-24 NOTE — Telephone Encounter (Signed)
Pls have her increase Keppra 500mg : take 1 tab BID until her f/u with me on Friday, thanks

## 2020-06-24 NOTE — Telephone Encounter (Signed)
Patient was called and scheduled for 06/28/20 at 4 PM with Dr. Delice Lesch.

## 2020-06-24 NOTE — Telephone Encounter (Signed)
Patient states that she was supposed to have a MRI and sleep study done but could not afford them she had a seizure last night while she was wake for the first time she wants to make appt but wasn't sure if she needed to have the MRI done first before we see her please call patient

## 2020-06-24 NOTE — Telephone Encounter (Signed)
Would need the MRI at some point, but not needed to be done before I see her. Pls put her on waitlist. Also pls confirm what medication she is taking and any triggers for this seizure? Pls remind her no driving until 6 months seizure-free. Thanks

## 2020-06-24 NOTE — Telephone Encounter (Signed)
Pt called and informed to increase Keppra 500mg : take 1 tab BID until her f/u with Dr Delice Lesch on Friday

## 2020-06-24 NOTE — Telephone Encounter (Signed)
I have an opening on Sept 17 at 4pm if she can come then for f/u, thanks

## 2020-06-24 NOTE — Telephone Encounter (Signed)
Pt c/o: seizure Missed medications?  no Sleep deprived?  No. Alcohol intake?  Yes.  a little  Back to their usual baseline self?  Yes.  . If no, advise go to ER Current medications prescribed by Dr. Delice Lesch: Keppra 500mg  QHS ( she took one this morning   Any triggers: no just happened, just felt tired, boyfriend and sister told her she had the seizure,

## 2020-06-28 ENCOUNTER — Other Ambulatory Visit: Payer: Self-pay

## 2020-06-28 ENCOUNTER — Encounter: Payer: Self-pay | Admitting: Neurology

## 2020-06-28 ENCOUNTER — Ambulatory Visit (INDEPENDENT_AMBULATORY_CARE_PROVIDER_SITE_OTHER): Payer: Self-pay | Admitting: Neurology

## 2020-06-28 VITALS — BP 120/83 | HR 100 | Ht 65.0 in | Wt 133.2 lb

## 2020-06-28 DIAGNOSIS — G40009 Localization-related (focal) (partial) idiopathic epilepsy and epileptic syndromes with seizures of localized onset, not intractable, without status epilepticus: Secondary | ICD-10-CM

## 2020-06-28 MED ORDER — LEVETIRACETAM 500 MG PO TABS: ORAL_TABLET | ORAL | 11 refills | Status: DC

## 2020-06-28 NOTE — Patient Instructions (Addendum)
1. Continue Keppra 500mg  twice a day  2. When able, schedule MRI brain with and without contrast and 1-hour EEG  3. The Epilepsy Foundation website (epilepsy.com) is a helpful website for information  4. Follow-up in 3 months, call for any changes   Seizure Precautions: 1. If medication has been prescribed for you to prevent seizures, take it exactly as directed.  Do not stop taking the medicine without talking to your doctor first, even if you have not had a seizure in a long time.   2. Avoid activities in which a seizure would cause danger to yourself or to others.  Don't operate dangerous machinery, swim alone, or climb in high or dangerous places, such as on ladders, roofs, or girders.  Do not drive unless your doctor says you may.  3. If you have any warning that you may have a seizure, lay down in a safe place where you can't hurt yourself.    4.  No driving for 6 months from last seizure, as per [facility].   Please refer to the following link on the [place] website for more information: http://www.epilepsyfoundation.org/answerplace/Social/driving/drivingu.cfm   5.  Maintain good sleep hygiene. Avoid alcohol  6.  Notify your neurology if you are planning pregnancy or if you become pregnant.  7.  Contact your doctor if you have any problems that may be related to the medicine you are taking.  8.  Call 911 and bring the patient back to the ED if:        A.  The seizure lasts longer than 5 minutes.       B.  The patient doesn't awaken shortly after the seizure  C.  The patient has new problems such as difficulty seeing, speaking or moving  D.  The patient was injured during the seizure  E.  The patient has a temperature over 102 F (39C)  F.  The patient vomited and now is having trouble breathing

## 2020-06-28 NOTE — Progress Notes (Signed)
NEUROLOGY FOLLOW UP OFFICE NOTE  Lori Collier [id number] 20-Jul-1991  HISTORY OF PRESENT ILLNESS: I had the pleasure of seeing Lori Collier in follow-up in the neurology clinic on 06/28/2020.  The patient was last seen 6 months ago for recurrent seizures. She is accompanied by her significant other who helps supplement the history today.  Since her last visit, she called our office to report a seizure during wakefulness. All of her prior seizures have been nocturnal. They report nocturnal seizures on 7/28 and 4/9 (she was asleep, got out of bed, then collapsed on the floor). On 06/23/20, she was in the living room then suddenly felt really tired. She does not recall events, but was witnessed to kneel and start speaking gibberish, then pointed her right hand to the ceiling, circling her arm. This was followed by a GTC. The seizure lasted 30-45 seconds. She was confused after and did not recognize people on the show they were watching. No focal weakness. On her last visit, she had been instructed to increase Levetiracetam to 500mg  in AM, 1000mg  in PM, however this caused crippling anxiety so she had been taking 1 tab BID until the beginning of August when she cut down to 1 tab qhs. She noticed that she would tend to have a seizure if she did not take the evening dose. She has random twitching/tremors in her fingers. She denies any olfactory/gustatory hallucinations, focal numbness/tingling/weakness. Sleep is good. No falls.    History on Initial Assessment 12/15/2019: This is a 29 year old right-handed woman with no significant past medical history presenting for evaluation of new onset seizures. She has had 4 nocturnal seizures since October 2020, waking her boyfriend up in the middle of the night. The first seizure occurred on 07/30/19 at 2am lasting 60 seconds. Her boyfriend heard her make a weird sound, like a growl, then her body tensed up with vigorous shaking and drooling. She bit her tongue. She was  very confused after, she recalls hearing what he was saying, then went back to sleep. The second seizure occurred again at 2am on 08/23/19 lasting 20 seconds. She was confused after, she could hear her boyfriend talking to her but it was like he was not speaking [place]. Her brain was slower the next day. She was brought to the ER where CBC, BMP, UDS negative. I personally reviewed head CT without contrast which was unremarkable. She was discharged home on Levetiracetam 500mg  BID. She had another possible seizure on 11/30 at 4:30am, she was breathing rapidly through her teeth with minor shaking, and was aware after but felt out of it the next day. The last seizure occurred on 10/14/19 at 3am, she reports it was very mild lasting 15-20 seconds. She had missed her LEV dose the night prior. She denies any staring/unresponsive episodes. There was one time while driving in January when she felt very tired and noted she was a lot further down the highway. She feels she also had Covid-19 at that time. Sometimes she tastes blood but feels it is from coughing a lot. She recalls an episode 1-2 months ago at work when her whole side felt weak for 30-60 minutes. She has a lot of tingling in her hands and feet. No myoclonic jerks. She has infrequent migraine with visual aura of white light with dots. Twice she could not see anything and vomited. She denies any dizziness, diplopia, dysarthria/dysphagia, neck/back pain, bowel/bladder dysfunction. Her anxiety is pretty bad. 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 She feels her memory is unchanged, there is a little trouble but she is doing pretty well in school. She has not noticed any significant side effects on LEV, her boyfriend notes she is a little more irritable, but she has not  noticed it. She feels very tired the past couple of weeks. Due to stress at school, she has been drinking 3 beers every night. She usually gets 8 hours of sleep.    Epilepsy Risk Factors:  Her half-sister had one seizure at age 27. She had a head injury at age 75 or 88 with a small scar on the right forehead hit by a metal door, no LOC. Otherwise she had a normal birth and early development.  There is no history of febrile convulsions, CNS infections such as meningitis/encephalitis, significant traumatic brain injury, neurosurgical procedures, or family history of seizures.   PAST MEDICAL HISTORY: History reviewed. No pertinent past medical history.  Outpatient Encounter Medications as of 06/28/2020  Medication Sig  . albuterol (VENTOLIN HFA) 108 (90 Base) MCG/ACT inhaler Inhale 1 puff into the lungs every 4 (four) hours as needed for shortness of breath.  . fluticasone (FLONASE) 50 MCG/ACT nasal spray Place 1 spray into both nostrils daily as needed for allergies or rhinitis.  Marland Kitchen ibuprofen (ADVIL) 200 MG tablet Take 400 mg by mouth every 6 (six) hours as needed for moderate pain.  .    . loratadine (CLARITIN) 10 MG tablet Take 10 mg by mouth daily as needed for allergies.   . medroxyPROGESTERone (DEPO-PROVERA) 150 MG/ML injection Inject 1 mL (150 mg total) into the muscle every 3 (three) months.  . naproxen sodium (ALEVE) 220 MG tablet Take 660 mg by mouth as needed (pain).  Marland Kitchen  levETIRAcetam (KEPPRA) 500 MG tablet Take 1 tab in AM, 2 tabs in PM (Patient taking differently: Take 1 tab in AM, 1 tabs in PM)               No facility-administered encounter medications on file as of 06/28/2020.    ALLERGIES: Allergies  Allergen Reactions  . Natural Vegetable Orange [Psyllium]     Tingling sensation in the tongue, and itchy throat per patient    FAMILY HISTORY: Family History  Problem Relation Age of Onset  . Hypertension Mother   . Thyroid disease Mother     SOCIAL HISTORY: Social History   Socioeconomic History  . Marital status: Single    Spouse name: Not on file  . Number of children: Not on file  . Years of education: Not on file  .  Highest education level: Not on file  Occupational History  . Not on file  Tobacco Use  . Smoking status: Current Some Day Smoker  . Smokeless tobacco: Never Used  Vaping Use  . Vaping Use: Every day  . Substances: Nicotine  Substance and Sexual Activity  . Alcohol use: Yes    Comment: occ  . Drug use: No  . Sexual activity: Not Currently    Comment: 1st intercourse- 16, partners- 22,   Other Topics Concern  . Not on file  Social History Narrative   Right handed    Lives with boy friend    One story home    Social Determinants of Health   Financial Resource Strain:   . Difficulty of Paying Living Expenses: Not on file  Food Insecurity:   . Worried About Charity fundraiser in the Last Year: Not on file  .  Ran Out of Food in the Last Year: Not on file  Transportation Needs:   . Lack of Transportation (Medical): Not on file  . Lack of Transportation (Non-Medical): Not on file  Physical Activity:   . Days of Exercise per Week: Not on file  . Minutes of Exercise per Session: Not on file  Stress:   . Feeling of Stress : Not on file  Social Connections:   . Frequency of Communication with Friends and Family: Not on file  . Frequency of Social Gatherings with Friends and Family: Not on file  . Attends Religious Services: Not on file  . Active Member of Clubs or Organizations: Not on file  . Attends Archivist Meetings: Not on file  . Marital Status: Not on file  Intimate Partner Violence:   . Fear of Current or Ex-Partner: Not on file  . Emotionally Abused: Not on file  . Physically Abused: Not on file  . Sexually Abused: Not on file     PHYSICAL EXAM: Vitals:   06/28/20 1552  BP: 120/83  Pulse: 100  SpO2: 99%   General: No acute distress Head:  Normocephalic/atraumatic Skin/Extremities: No rash, no edema Neurological Exam: alert and oriented to person, place, and time. No aphasia or dysarthria. Fund of knowledge is appropriate.  Recent and remote  memory are intact.  Attention and concentration are normal.   Cranial nerves: Pupils equal, round. Extraocular movements intact with no nystagmus. Visual fields full.  No facial asymmetry.  Motor: Bulk and tone normal, muscle strength 5/5 throughout with no pronator drift.   Finger to nose testing intact.  Gait narrow-based and steady, able to tandem walk adequately.  Romberg negative.   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 She had her first seizure in wakefulness last 06/23/20. She had self-reduced Levetiracetam to 500mg  qhs a few weeks prior. We discussed increasing back to 500mg  BID. She did not tolerate 1500mg  dose. If seizures continue despite medication compliance, we will start Lamotrigine. 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 Proceed with MRI brain with and without contrast and 1-hour EEG when able. No pregnancy plans. She is aware of [place] driving laws to stop driving after a seizure, until 6 months seizure-free.  Follow-up in 3 months, she knows to call for any changes.     Thank you for allowing me to participate in her care.  Please do not hesitate to call for any questions or concerns.   Ellouise Newer, M.D.   CC: Peggye Pitt, PA-C

## 2020-07-01 ENCOUNTER — Encounter: Payer: Self-pay | Admitting: Neurology

## 2020-07-17 ENCOUNTER — Ambulatory Visit
Admission: RE | Admit: 2020-07-17 | Discharge: 2020-07-17 | Disposition: A | Discharge status: Home / Self Care | Payer: No Typology Code available for payment source | Source: Ambulatory Visit | Attending: Neurology | Admitting: Neurology

## 2020-07-17 ENCOUNTER — Other Ambulatory Visit: Payer: Self-pay

## 2020-07-17 DIAGNOSIS — G40009 Localization-related (focal) (partial) idiopathic epilepsy and epileptic syndromes with seizures of localized onset, not intractable, without status epilepticus: Secondary | ICD-10-CM

## 2020-07-17 MED ORDER — GADOBENATE DIMEGLUMINE 529 MG/ML IV SOLN
12.0000 mL | Freq: Once | INTRAVENOUS | Status: AC | PRN
  Administered 2020-07-17: 12 mL via INTRAVENOUS

## 2020-07-18 ENCOUNTER — Telehealth: Payer: Self-pay

## 2020-07-18 NOTE — Telephone Encounter (Signed)
[facility] called with MRI report Dr Delice Lesch made aware.

## 2020-07-19 ENCOUNTER — Telehealth: Payer: Self-pay | Admitting: Neurology

## 2020-07-19 NOTE — Telephone Encounter (Signed)
Can you let patient know I'd like to discuss the MRI results with her in person so we can go over the brain scan together, can she come on Monday at 12nn? Thanks

## 2020-07-19 NOTE — Telephone Encounter (Signed)
Patient called in wanting to find out the results of her MRI.

## 2020-07-19 NOTE — Telephone Encounter (Signed)
Pt called will come in on Monday at noon to talk to Dr Delice Lesch

## 2020-07-22 ENCOUNTER — Ambulatory Visit (INDEPENDENT_AMBULATORY_CARE_PROVIDER_SITE_OTHER): Payer: Self-pay | Admitting: Neurology

## 2020-07-22 ENCOUNTER — Encounter: Payer: Self-pay | Admitting: Neurology

## 2020-07-22 ENCOUNTER — Other Ambulatory Visit: Payer: Self-pay

## 2020-07-22 VITALS — BP 130/92 | HR 81 | Ht 65.0 in | Wt 133.2 lb

## 2020-07-22 DIAGNOSIS — R9089 Other abnormal findings on diagnostic imaging of central nervous system: Secondary | ICD-10-CM

## 2020-07-22 DIAGNOSIS — G40009 Localization-related (focal) (partial) idiopathic epilepsy and epileptic syndromes with seizures of localized onset, not intractable, without status epilepticus: Secondary | ICD-10-CM

## 2020-07-22 NOTE — Patient Instructions (Addendum)
1. Refer to Neurosurgery for evaluation and discussion about biopsy  2. Repeat MRI brain with and without contrast will be ordered for January 2021.  3. Continue Keppra 500mg  twice a day, call for any changes  4. Follow-up as scheduled in January, ideally after repeat brain MRI so we can go over repeat study  Seizure Precautions: 1. If medication has been prescribed for you to prevent seizures, take it exactly as directed.  Do not stop taking the medicine without talking to your doctor first, even if you have not had a seizure in a long time.   2. Avoid activities in which a seizure would cause danger to yourself or to others.  Don't operate dangerous machinery, swim alone, or climb in high or dangerous places, such as on ladders, roofs, or girders.  Do not drive unless your doctor says you may.  3. If you have any warning that you may have a seizure, lay down in a safe place where you can't hurt yourself.    4.  No driving for 6 months from last seizure, as per [facility].   Please refer to the following link on the [employer] website for more information: http://www.epilepsyfoundation.org/answerplace/Social/driving/drivingu.cfm   5.  Maintain good sleep hygiene. Avoid alcohol.  6.  Notify your neurology if you are planning pregnancy or if you become pregnant.  7.  Contact your doctor if you have any problems that may be related to the medicine you are taking.  8.  Call 911 and bring the patient back to the ED if:        A.  The seizure lasts longer than 5 minutes.       B.  The patient doesn't awaken shortly after the seizure  C.  The patient has new problems such as difficulty seeing, speaking or moving  D.  The patient was injured during the seizure  E.  The patient has a temperature over 102 F (39C)  F.  The patient vomited and now is having trouble breathing

## 2020-07-22 NOTE — Progress Notes (Signed)
NEUROLOGY FOLLOW UP OFFICE NOTE  Lori Collier [id number] [birth year]  HISTORY OF PRESENT ILLNESS: I had the pleasure of seeing Lori Collier in follow-up in the neurology clinic on 07/22/2020.  The patient was last seen a month ago for recurrent seizures. She is alone in the office today. She presents for an earlier visit to discuss MRI brain results. We went over images today, there were 2 focal areas of increased T2 signal involving the cortex and juxtacortical white matter of the right insula and left superior frontal gyrus with loss of gray-white differentiation with ill-defined margins, mild swelling/expansion. There was no abnormal enhancement or restricted diffusion. Hippocampi symmetric. She denies any seizures since 06/23/20, no side effects on Levetiracetam 500mg  BID. She has had a couple of migraines with visual aura, she gets nausea if she does not lay in a dark room. No focal numbness/tingling/weakness.    History on Initial Assessment 12/15/2019: This is a 29 year old right-handed woman with no significant past medical history presenting for evaluation of new onset seizures. She has had 4 nocturnal seizures since October 2020, waking her boyfriend up in the middle of the night. The first seizure occurred on 07/30/19 at 2am lasting 60 seconds. Her boyfriend heard her make a weird sound, like a growl, then her body tensed up with vigorous shaking and drooling. She bit her tongue. She was very confused after, she recalls hearing what he was saying, then went back to sleep. The second seizure occurred again at 2am on 08/23/19 lasting 20 seconds. She was confused after, she could hear her boyfriend talking to her but it was like he was not speaking [place]. Her brain was slower the next day. She was brought to the ER where CBC, BMP, UDS negative. I personally reviewed head CT without contrast which was unremarkable. She was discharged home on Levetiracetam 500mg  BID. She had another possible  seizure on 11/30 at 4:30am, she was breathing rapidly through her teeth with minor shaking, and was aware after but felt out of it the next day. The last seizure occurred on 10/14/19 at 3am, she reports it was very mild lasting 15-20 seconds. She had missed her LEV dose the night prior. She denies any staring/unresponsive episodes. There was one time while driving in January when she felt very tired and noted she was a lot further down the highway. She feels she also had Covid-19 at that time. Sometimes she tastes blood but feels it is from coughing a lot. She recalls an episode 1-2 months ago at work when her whole side felt weak for 30-60 minutes. She has a lot of tingling in her hands and feet. No myoclonic jerks. She has infrequent migraine with visual aura of white light with dots. Twice she could not see anything and vomited. She denies any dizziness, diplopia, dysarthria/dysphagia, neck/back pain, bowel/bladder dysfunction. Her anxiety is pretty bad. 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 She feels her memory is unchanged, there is a little trouble but she is doing pretty well in school. She has not noticed any significant side effects on LEV, her boyfriend notes she is a little more irritable, but she has not  noticed it. She feels very tired the past couple of weeks. Due to stress at school, she has been drinking 3 beers every night. She usually gets 8 hours of sleep.   Update 06/28/2020: She is accompanied by her significant other who helps supplement the history today.  Since her last visit, she called our office to report a seizure during wakefulness. All of her prior seizures have been nocturnal. They report nocturnal seizures on 7/28 and 4/9 (she was asleep, got out of bed, then collapsed on the floor). On 06/23/20, she was in the living room then suddenly felt really tired. She does not recall events, but was witnessed to kneel and start speaking gibberish, then  pointed her right hand to the ceiling, circling her arm. This was followed by a GTC. The seizure lasted 30-45 seconds. She was confused after and did not recognize people on the show they were watching. No focal weakness. On her last visit, she had been instructed to increase Levetiracetam to 500mg  in AM, 1000mg  in PM, however this caused crippling anxiety so she had been taking 1 tab BID until the beginning of August when she cut down to 1 tab qhs. She noticed that she would tend to have a seizure if she did not take the evening dose. She has random twitching/tremors in her fingers. She denies any olfactory/gustatory hallucinations, focal numbness/tingling/weakness. Sleep is good. No falls.   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 Her half-sister had one seizure at age 27. She had a head injury at age 29 or 29 with a small scar on the right forehead hit by a metal door, no LOC. Otherwise she had a normal birth and early development.  There is no history of febrile convulsions, CNS infections such as meningitis/encephalitis, significant traumatic brain injury, neurosurgical procedures, or family history of seizures.   PAST MEDICAL HISTORY: History reviewed. No pertinent past medical history.  MEDICATIONS: Current Outpatient Medications on File Prior to Visit  Medication Sig Dispense Refill   albuterol (VENTOLIN HFA) 108 (90 Base) MCG/ACT inhaler Inhale 1 puff into the lungs every 4 (four) hours as needed for shortness of breath.     fluticasone (FLONASE) 50 MCG/ACT nasal spray Place 1 spray into both nostrils daily as needed for allergies or rhinitis.     ibuprofen (ADVIL) 200 MG tablet Take 400 mg by mouth every 6 (six) hours as needed for moderate pain.     levETIRAcetam (KEPPRA) 500 MG tablet Take 1 tablet twice a day 60 tablet 11   loratadine (CLARITIN) 10 MG tablet Take 10 mg by mouth daily  as needed for allergies.      medroxyPROGESTERone (DEPO-PROVERA) 150 MG/ML injection Inject 1 mL (150 mg total) into the muscle every 3 (three) months. 1 mL 2   naproxen sodium (ALEVE) 220 MG tablet Take 660 mg by mouth as needed (pain).     No current facility-administered medications on file prior to visit.    ALLERGIES: Allergies  Allergen Reactions   Natural Vegetable Orange [Psyllium]     Tingling sensation in the tongue, and itchy throat per patient    FAMILY HISTORY: Family History  Problem Relation Age of Onset   Hypertension Mother    Thyroid disease Mother     SOCIAL HISTORY: Social History   Socioeconomic History   Marital status: Single    Spouse name: Not on file   Number of children: Not on file   Years of education: Not on file   Highest education level: Not on file  Occupational History   Not on file  Tobacco Use   Smoking status: Current Some Day Smoker   Smokeless tobacco: Never Used  Media planner  Vaping Use: Every day   Substances: Nicotine  Substance and Sexual Activity   Alcohol use: Yes    Comment: occ   Drug use: No   Sexual activity: Not Currently    Comment: 1st intercourse- 16, partners- 22,   Other Topics Concern   Not on file  Social History Narrative   Right handed    Lives with boy friend    One story home    Social Determinants of Health   Financial Resource Strain:    Difficulty of Paying Living Expenses: Not on file  Food Insecurity:    Worried About Charity fundraiser in the Last Year: Not on file   [employer] of Food in the Last Year: Not on file  Transportation Needs:    Lack of Transportation (Medical): Not on file   Lack of Transportation (Non-Medical): Not on file  Physical Activity:    Days of Exercise per Week: Not on file   Minutes of Exercise per Session: Not on file  Stress:    Feeling of Stress : Not on file  Social Connections:    Frequency of Communication with Friends and  Family: Not on file   Frequency of Social Gatherings with Friends and Family: Not on file   Attends Religious Services: Not on file   Active Member of Clubs or Organizations: Not on file   Attends Archivist Meetings: Not on file   Marital Status: Not on file  Intimate Partner Violence:    Fear of Current or Ex-Partner: Not on file   Emotionally Abused: Not on file   Physically Abused: Not on file   Sexually Abused: Not on file     PHYSICAL EXAM: Vitals:   07/22/20 1207  BP: (!) 130/92  Pulse: 81  SpO2: 99%   General: No acute distress Head:  Normocephalic/atraumatic Skin/Extremities: No rash, no edema Neurological Exam: awake and alert, no aphasia or dysarthria. Fund of knowledge is appropriate.  Recent and remote memory are intact.  Attention and concentration are normal.   Cranial nerves: Pupils equal, round. Extraocular movements intact with no nystagmus. Visual fields full.  No facial asymmetry.  Motor: Bulk and tone normal, muscle strength 5/5 throughout with no pronator drift.   Finger to nose testing intact.  Gait narrow-based and steady, able to tandem walk adequately.  Romberg negative.   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 Her last seizure was on 06/23/20. She presents today to discuss MRI brain findings showing 2 focal areas of increased signal in the right insula and left superior frontal gyrus, we discussed differentials which include low grade glioma versus focal cortical dysplasia. I had spoken to Neuroradiology earlier, MR spectroscopy not felt to be very helpful as repeat MRI brain would be recommended anyway, she will be scheduled for repeat MRI brain with and without contrast for interval follow-up in January 2021. She will be referred to Neurosurgery for evaluation and consideration of biopsy versus monitoring. Continue Levetiracetam 500mg   BID, we discussed that if seizures recur, would add on Lamotrigine since she had side effects on higher dose of LEV. She is aware of [place] driving laws to stop driving after a seizure until 6 months seizure-free. Follow-up after MRI brain in January 2021, she knows to call for any changes.    Thank you for allowing me to participate in her care.  Please do not hesitate  to call for any questions or concerns.   Ellouise Newer, M.D.   CC: Peggye Pitt, PA-C

## 2020-08-08 ENCOUNTER — Ambulatory Visit: Payer: Medicaid Other

## 2020-09-02 ENCOUNTER — Telehealth: Payer: Self-pay | Admitting: Neurology

## 2020-09-02 DIAGNOSIS — G40009 Localization-related (focal) (partial) idiopathic epilepsy and epileptic syndromes with seizures of localized onset, not intractable, without status epilepticus: Secondary | ICD-10-CM

## 2020-09-02 MED ORDER — LEVETIRACETAM 500 MG PO TABS: ORAL_TABLET | ORAL | 1 refills | Status: DC

## 2020-09-02 NOTE — Addendum Note (Signed)
Addended by: Jake Seats on: 09/02/2020 03:21 PM   Modules accepted: Orders

## 2020-09-02 NOTE — Telephone Encounter (Signed)
Spoke with pt to have her increase Keppra 500mg : Take 1 and 1/2 tablets twice a day (which would be similar total amount to taking TID). For mood side effects from Keppra, sometimes taking a daily vitamin B6 50mg  tablet can help

## 2020-09-02 NOTE — Telephone Encounter (Signed)
Patient called to report that she had a seizure Saturday at 2am. Patient would like to know if she can be upped to 90 pills for 30 days. Please call.

## 2020-09-02 NOTE — Telephone Encounter (Signed)
Pls have her increase Keppra 500mg : Take 1 and 1/2 tablets twice a day (which would be similar total amount to taking TID). For mood side effects from Keppra, sometimes taking a daily vitamin B6 50mg  tablet can help. If she is agreeable to increase in Keppra, can send in a 90-day Rx for Keppra 500mg  1.5 tabs BID. Thanks

## 2020-09-02 NOTE — Telephone Encounter (Signed)
Pt c/o: seizure Missed medications?  No. Sleep deprived?  No. Alcohol intake?  Yes.   Back to their usual baseline self?  Yes.  . If no, advise go to ER Current medications prescribed by Dr. Delice Lesch: Keppra 500mg  1 tablet BID  no increase in stress  Fall out of bed from seizure, had migraine and fog day after  Asking if she can try Keppra 500 mg 1 tab TID again and if she does can she have something for anxiety to help her take it because last time she had increase in anxiety with increase in the keppra

## 2020-09-06 ENCOUNTER — Other Ambulatory Visit: Payer: Self-pay | Admitting: Obstetrics & Gynecology

## 2020-10-14 ENCOUNTER — Ambulatory Visit: Payer: Medicaid Other | Admitting: Neurology

## 2020-10-15 ENCOUNTER — Other Ambulatory Visit: Payer: Self-pay

## 2020-10-15 ENCOUNTER — Ambulatory Visit
Admission: RE | Admit: 2020-10-15 | Discharge: 2020-10-15 | Disposition: A | Discharge status: Home / Self Care | Payer: PRIVATE HEALTH INSURANCE | Source: Ambulatory Visit | Attending: Neurology | Admitting: Neurology

## 2020-10-15 DIAGNOSIS — G40009 Localization-related (focal) (partial) idiopathic epilepsy and epileptic syndromes with seizures of localized onset, not intractable, without status epilepticus: Secondary | ICD-10-CM

## 2020-10-15 DIAGNOSIS — R9089 Other abnormal findings on diagnostic imaging of central nervous system: Secondary | ICD-10-CM

## 2020-10-15 MED ORDER — GADOBENATE DIMEGLUMINE 529 MG/ML IV SOLN
12.0000 mL | Freq: Once | INTRAVENOUS | Status: AC | PRN
  Administered 2020-10-15: 12 mL via INTRAVENOUS

## 2020-10-24 ENCOUNTER — Other Ambulatory Visit: Payer: Self-pay

## 2020-10-24 ENCOUNTER — Encounter: Payer: Self-pay | Admitting: Neurology

## 2020-10-24 ENCOUNTER — Telehealth (INDEPENDENT_AMBULATORY_CARE_PROVIDER_SITE_OTHER): Payer: PRIVATE HEALTH INSURANCE | Admitting: Neurology

## 2020-10-24 VITALS — Ht 65.0 in | Wt 135.0 lb

## 2020-10-24 DIAGNOSIS — R9089 Other abnormal findings on diagnostic imaging of central nervous system: Secondary | ICD-10-CM | POA: Diagnosis not present

## 2020-10-24 DIAGNOSIS — G40009 Localization-related (focal) (partial) idiopathic epilepsy and epileptic syndromes with seizures of localized onset, not intractable, without status epilepticus: Secondary | ICD-10-CM

## 2020-10-24 MED ORDER — LEVETIRACETAM 500 MG PO TABS: ORAL_TABLET | ORAL | 3 refills | Status: DC

## 2020-10-24 NOTE — Progress Notes (Signed)
Virtual Visit via Video Note The purpose of this virtual visit is to provide medical care while limiting exposure to the novel coronavirus.    Consent was obtained for video visit:  Yes.   Answered questions that patient had about telehealth interaction:  Yes.   I discussed the limitations, risks, security and privacy concerns of performing an evaluation and management service by telemedicine. I also discussed with the patient that there may be a patient responsible charge related to this service. The patient expressed understanding and agreed to proceed.  Pt location: Home Physician Location: office Name of referring provider:  Jenny Reichmann, P* I connected with Lori Collier at patients initiation/request on 10/24/2020 at 12:00 PM EST by video enabled telemedicine application and verified that I am speaking with the correct person using two identifiers. Pt MRN:  [id number] Pt DOB:  1990-11-27 Video Participants:  Lori Collier   History of Present Illness:  The patient had a virtual video visit on 10/24/2020. She was last seen 3 months ago for seizures secondary to abnormal MRI brain. 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 Repeat MRI brain with and without contrast done 10/15/20 did not show any changes from prior scan. She called our office to report a nocturnal seizure on 08/31/20 at 2am, making her fall out of bed. Levetiracetam dose increased to 750mg  BID. Initially it caused anxiety but this has leveled out, she feels good on current dose. Some nights (or days) she is not sure if she is paranoid but would feel confused so she would take 1000mg  instead of 750mg , she does this around once a week. She denies any staring/unresponsive episodes, gaps in time, olfactory/gustatory hallucinations, focal  numbness/tingling/weakness. Sometimes she has little involuntary shaking that no one can tell is happening but she feels it in either right or left leg, or both arms. She has rare migraines around 3-4 times a year, last migraine was mid-December. She would usually have visual auras where she cannot see and feels nauseated. She has occasional floaters in the left eye. Sleep is good. Mood is a lot better, the anxiety is gone and she sleeps well at night.    History on Initial Assessment 12/15/2019: This is a 30 year old right-handed woman with no significant past medical history presenting for evaluation of new onset seizures. She has had 4 nocturnal seizures since October 2020, waking her boyfriend up in the middle of the night. The first seizure occurred on 07/30/19 at 2am lasting 60 seconds. Her boyfriend heard her make a weird sound, like a growl, then her body tensed up with vigorous shaking and drooling. She bit her tongue. She was very confused after, she recalls hearing what he was saying, then went back to sleep. The second seizure occurred again at 2am on 08/23/19 lasting 20 seconds. She was confused after, she could hear her boyfriend talking to her but it was like he was not speaking [place]. Her brain was slower the next day. She was brought to the ER where CBC, BMP, UDS negative. I personally reviewed head CT without contrast which was unremarkable. She was discharged home on Levetiracetam 500mg  BID. She had another possible seizure on 11/30 at 4:30am, she was breathing rapidly through her teeth with minor shaking, and was aware after but felt out of it the  next day. The last seizure occurred on 10/14/19 at 3am, she reports it was very mild lasting 15-20 seconds. She had missed her LEV dose the night prior. She denies any staring/unresponsive episodes. There was one time while driving in January when she felt very tired and noted she was a lot further down the highway. She feels she also had Covid-19 at  that time. Sometimes she tastes blood but feels it is from coughing a lot. She recalls an episode 1-2 months ago at work when her whole side felt weak for 30-60 minutes. She has a lot of tingling in her hands and feet. No myoclonic jerks. She has infrequent migraine with visual aura of white light with dots. Twice she could not see anything and vomited. She denies any dizziness, diplopia, dysarthria/dysphagia, neck/back pain, bowel/bladder dysfunction. Her anxiety is pretty bad. 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 She feels her memory is unchanged, there is a little trouble but she is doing pretty well in school. She has not noticed any significant side effects on LEV, her boyfriend notes she is a little more irritable, but she has not  noticed it. She feels very tired the past couple of weeks. Due to stress at school, she has been drinking 3 beers every night. She usually gets 8 hours of sleep.   Update 06/28/2020: She is accompanied by her significant other who helps supplement the history today.  Since her last visit, she called our office to report a seizure during wakefulness. All of her prior seizures have been nocturnal. They report nocturnal seizures on 7/28 and 4/9 (she was asleep, got out of bed, then collapsed on the floor). On 06/23/20, she was in the living room then suddenly felt really tired. She does not recall events, but was witnessed to kneel and start speaking gibberish, then pointed her right hand to the ceiling, circling her arm. This was followed by a GTC. The seizure lasted 30-45 seconds. She was confused after and did not recognize people on the show they were watching. No focal weakness. On her last visit, she had been instructed to increase Levetiracetam to 500mg  in AM, 1000mg  in PM, however this caused crippling anxiety so she had been taking 1 tab BID until the beginning of August when she cut down to 1 tab qhs. She noticed that she would tend to  have a seizure if she did not take the evening dose. She has random twitching/tremors in her fingers. She denies any olfactory/gustatory hallucinations, focal numbness/tingling/weakness. Sleep is good. No falls.   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 Her half-sister had one seizure at age 18. She had a head injury at age 20 or 38 with a small scar on the right forehead hit by a metal door, no LOC. Otherwise she had a normal birth and early development.  There is no history of febrile convulsions, CNS infections such as meningitis/encephalitis, significant traumatic brain injury, neurosurgical procedures, or family history of seizures.     Current Outpatient Medications on File Prior to Visit  Medication Sig Dispense Refill  . albuterol (VENTOLIN HFA) 108 (90 Base) MCG/ACT inhaler Inhale 1 puff into the lungs every 4 (four) hours as needed for shortness of breath.    . fluticasone (FLONASE) 50 MCG/ACT nasal spray Place 1 spray into both nostrils daily as needed for allergies or rhinitis.    Marland Kitchen  ibuprofen (ADVIL) 200 MG tablet Take 400 mg by mouth every 6 (six) hours as needed for moderate pain.    Marland Kitchen levETIRAcetam (KEPPRA) 500 MG tablet Take 1.5 tablet twice a day 270 tablet 1  . loratadine (CLARITIN) 10 MG tablet Take 10 mg by mouth daily as needed for allergies.     . naproxen sodium (ALEVE) 220 MG tablet Take 660 mg by mouth as needed (pain).    . medroxyPROGESTERone (DEPO-PROVERA) 150 MG/ML injection Inject 1 mL (150 mg total) into the muscle every 3 (three) months. (Patient not taking: Reported on 10/24/2020) 1 mL 2   No current facility-administered medications on file prior to visit.     Observations/Objective:   Vitals:   10/24/20 0824  Weight: 135 lb (61.2 kg)  Height: 5\' 5"  (1.651 m)   GEN:  The patient appears stated age and is in NAD.  Neurological examination: Patient is awake,  alert. No aphasia or dysarthria. Intact fluency and comprehension. Remote and recent memory intact. Cranial nerves: Extraocular movements intact with no nystagmus. No facial asymmetry. Motor: moves all extremities symmetrically, at least anti-gravity x 4. No incoordination on finger to nose testing. Gait: narrow-based and steady, able to tandem walk adequately. Negative Romberg test.   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 Her last seizure was on 08/31/20. She is currently on Levetiracetam 750mg  BID without side effects. Sometimes she takes 1000mg  when she feels a little confused, she will keep a calendar of symptoms and if noticing she is taking higher dose frequently, we will increase dose to 1000mg  BID. We discussed repeat MRI brain done 10/15/2020, again showing 2 focal areas of increased signal in the right insula and left superior frontal gyrus, differentials continue to include low grade glioma versus focal cortical dysplasia. She has not seen Neurosurgery yet, referral will be sent again. She is aware of [place] driving laws to stop driving after a seizure until 6 months seizure-free. Follow-up in 3-4 months, she knows to call for any changes.    Follow Up Instructions:   -I discussed the assessment and treatment plan with the patient. The patient was provided an opportunity to ask questions and all were answered. The patient agreed with the plan and demonstrated an understanding of the instructions.   The patient was advised to call back or seek an in-person evaluation if the symptoms worsen or if the condition fails to improve as anticipated.    Cameron Sprang, MD

## 2020-10-29 ENCOUNTER — Telehealth: Payer: Self-pay | Admitting: Neurology

## 2020-10-29 NOTE — Telephone Encounter (Signed)
Called and spoke to patient and informed her that her referral was sent and all information needed was sent with referral. Informed patient that I have resent her referral packet to Neuro Surgery and that I would call to ensure they received it and notify patient. Informed patient that we are short staff due to snow, so if she does not receive a call back from me today I will call her tomorrow. Patient understood and had not other questions or concerns.

## 2020-10-29 NOTE — Telephone Encounter (Signed)
Referral was located on [employer]. I checked to ensure it was completed with appropriate documents attached. Referral and documents were attached appropriately and I have re-faxed to Neuro Surgery.

## 2020-10-29 NOTE — Telephone Encounter (Signed)
Patient called and said she received a call from [facility] and was told they've received 6 incomplete referrals to their office. She said they wanted the patient to call and relay the information and assist.  Patient further stated, "This referral should have went to them in October of 2021 so I am really behind being seen."

## 2020-10-30 NOTE — Telephone Encounter (Signed)
[facility] to ensure referral was sent. Left message for a call back with the referral coordinator.

## 2020-11-01 NOTE — Telephone Encounter (Signed)
Rollene Fare from [facility] called and left a message and stated that they have received the complete referral. Rollene Fare stated that they have spoken to patient to schedule her and patient refused an appointment. Patient stated that "she doesn't know who she wants to see and wants to talk to her mother." Rollene Fare is waiting for patient to call her back to schedule her appt.

## 2020-11-04 ENCOUNTER — Other Ambulatory Visit: Payer: Self-pay

## 2020-11-04 DIAGNOSIS — G40009 Localization-related (focal) (partial) idiopathic epilepsy and epileptic syndromes with seizures of localized onset, not intractable, without status epilepticus: Secondary | ICD-10-CM

## 2020-11-04 MED ORDER — LEVETIRACETAM 500 MG PO TABS: ORAL_TABLET | ORAL | 3 refills | Status: DC

## 2020-11-08 ENCOUNTER — Other Ambulatory Visit: Payer: Self-pay | Admitting: Radiation Therapy

## 2020-11-11 ENCOUNTER — Inpatient Hospital Stay: Payer: PRIVATE HEALTH INSURANCE | Attending: Neurosurgery

## 2020-11-15 ENCOUNTER — Other Ambulatory Visit: Payer: Self-pay | Admitting: Neurosurgery

## 2020-11-20 ENCOUNTER — Other Ambulatory Visit (HOSPITAL_COMMUNITY): Payer: Self-pay | Admitting: Neurosurgery

## 2020-11-20 ENCOUNTER — Other Ambulatory Visit: Payer: Self-pay | Admitting: Neurosurgery

## 2020-11-20 DIAGNOSIS — G939 Disorder of brain, unspecified: Secondary | ICD-10-CM

## 2020-11-21 ENCOUNTER — Encounter: Payer: Self-pay | Admitting: Obstetrics & Gynecology

## 2020-11-21 ENCOUNTER — Ambulatory Visit (INDEPENDENT_AMBULATORY_CARE_PROVIDER_SITE_OTHER): Payer: PRIVATE HEALTH INSURANCE | Admitting: Obstetrics & Gynecology

## 2020-11-21 ENCOUNTER — Other Ambulatory Visit: Payer: Self-pay

## 2020-11-21 VITALS — BP 108/62 | HR 62 | Resp 20 | Ht 64.57 in | Wt 131.4 lb

## 2020-11-21 DIAGNOSIS — Z113 Encounter for screening for infections with a predominantly sexual mode of transmission: Secondary | ICD-10-CM

## 2020-11-21 DIAGNOSIS — N809 Endometriosis, unspecified: Secondary | ICD-10-CM | POA: Diagnosis not present

## 2020-11-21 DIAGNOSIS — Z01419 Encounter for gynecological examination (general) (routine) without abnormal findings: Secondary | ICD-10-CM

## 2020-11-21 MED ORDER — NORETHINDRONE 0.35 MG PO TABS: 1.0000 | ORAL_TABLET | Freq: Every day | ORAL | 4 refills | Status: DC

## 2020-11-21 NOTE — Progress Notes (Signed)
Lori Collier 08/31/1991 [id number]   History:    30 y.o. G0  Single.  Nursing school.  RP: BTB on DepoProvera for severe Endometriosis  HPI: S/P Robotic Bilateral Ovarian Cystectomies for Endometriosis on 12/12/2018.  Breakthrough bleeding frequently, decided to stop DepoProvera, last injection received 07/2020.  Currently just mild spotting.  No pelvic pain.  Not sexually active currently.  Breasts normal.  BMI 22.16.  Patient has 2 brain tumors (Gliomas?) for which surgery is scheduled in 11/2020.  Past medical history,surgical history, family history and social history were all reviewed and documented in the EPIC chart.  Gynecologic History Patient's last menstrual period was 11/12/2020 (approximate).  Obstetric History OB History  Gravida Para Term Preterm AB Living  0 0 0 0 0 0  SAB IAB Ectopic Multiple Live Births  0 0 0 0 0     ROS: A ROS was performed and pertinent positives and negatives are included in the history.  GENERAL: No fevers or chills. HEENT: No change in vision, no earache, sore throat or sinus congestion. NECK: No pain or stiffness. CARDIOVASCULAR: No chest pain or pressure. No palpitations. PULMONARY: No shortness of breath, cough or wheeze. GASTROINTESTINAL: No abdominal pain, nausea, vomiting or diarrhea, melena or bright red blood per rectum. GENITOURINARY: No urinary frequency, urgency, hesitancy or dysuria. MUSCULOSKELETAL: No joint or muscle pain, no back pain, no recent trauma. DERMATOLOGIC: No rash, no itching, no lesions. ENDOCRINE: No polyuria, polydipsia, no heat or cold intolerance. No recent change in weight. HEMATOLOGICAL: No anemia or easy bruising or bleeding. NEUROLOGIC: No headache, seizures, numbness, tingling or weakness. PSYCHIATRIC: No depression, no loss of interest in normal activity or change in sleep pattern.     Exam:   BP 108/62 (BP Location: Right Arm, Patient Position: Sitting)   Pulse 62   Resp 20   Ht 5' 4.57" (1.64 m)   Wt  131 lb 6.4 oz (59.6 kg)   LMP 11/12/2020 (Approximate)   BMI 22.16 kg/m   Body mass index is 22.16 kg/m.  General appearance : Well developed well nourished female. No acute distress HEENT: Eyes: no retinal hemorrhage or exudates,  Neck supple, trachea midline, no carotid bruits, no thyroidmegaly Lungs: Clear to auscultation, no rhonchi or wheezes, or rib retractions  Heart: Regular rate and rhythm, no murmurs or gallops Breast:Examined in sitting and supine position were symmetrical in appearance, no palpable masses or tenderness,  no skin retraction, no nipple inversion, no nipple discharge, no skin discoloration, no axillary or supraclavicular lymphadenopathy Abdomen: no palpable masses or tenderness, no rebound or guarding Extremities: no edema or skin discoloration or tenderness  Pelvic: Vulva: Normal             Vagina: No gross lesions or discharge  Cervix: No gross lesions or discharge.  Pap reflex/Gono-Chlam done.  Uterus  AV, normal size, shape and consistency, non-tender and mobile  Adnexa  Without masses or tenderness  Anus: Normal   Assessment/Plan:  30 y.o. female for annual exam   1. Encounter for routine gynecological examination with Papanicolaou smear of cervix Normal gynecologic exam.  Pap reflex done.  Breast exam normal.  Good body mass index at 22.16.  Continue with fitness and healthy nutrition. - Pap IG, CT/NG NAA, and HPV (high risk)  2. Endometriosis, severe History of severe endometriosis.  Status post robotic bilateral ovarian cystectomies for endometriosis in March 2020.  Patient had a last Depo-Provera injection October 2021.  Decision to control endometriosis with the  progestin only birth control pill at this time.  Usage, risks and benefits reviewed with patient.  Prescription sent to pharmacy.  3. Screen for STD (sexually transmitted disease) Gonorrhea and Chlamydia done on Pap test.  Other orders - norethindrone (MICRONOR) 0.35 MG tablet; Take 1  tablet (0.35 mg total) by mouth daily.  [name] MD, 1:48 PM 11/21/2020

## 2020-11-25 LAB — PAP IG, CT-NG NAA, HPV HIGH-RISK
C. trachomatis RNA, TMA: NOT DETECTED
HPV DNA High Risk: NOT DETECTED
N. gonorrhoeae RNA, TMA: NOT DETECTED

## 2020-11-29 NOTE — Progress Notes (Signed)
Surgical Instructions    Your procedure is scheduled on 12/04/20.  Report to [facility] Main Entrance "A" at 05:30 A.M., then check in with the Admitting office.  Call this number if you have problems the morning of surgery:  [phone]   If you have any questions prior to your surgery date call [phone]: Open Monday-Friday 8am-4pm    Remember:  Do not eat after midnight the night before your surgery  You may drink clear liquids until 04:30am the morning of your surgery.   Clear liquids allowed are: Water, Non-Citrus Juices (without pulp), Carbonated Beverages, Clear Tea, Black Coffee Only, and Gatorade    Take these medicines the morning of surgery with A SIP OF WATER  albuterol (VENTOLIN HFA) if needed- bring inhaler with you the day of surgery levETIRAcetam (KEPPRA) norethindrone (MICRONOR)  As of today, STOP taking any Aspirin (unless otherwise instructed by your surgeon) Aleve, Naproxen, Ibuprofen, Motrin, Advil, Goody's, BC's, all herbal medications, fish oil, and all vitamins.                     Do not wear jewelry, make up, or nail polish            Do not wear lotions, powders, perfumes/colognes, or deodorant.            Do not shave 48 hours prior to surgery.              Do not bring valuables to the hospital.            [facility] is not responsible for any belongings or valuables.  Do NOT Smoke (Tobacco/Vaping) or drink Alcohol 24 hours prior to your procedure If you use a CPAP at night, you may bring all equipment for your overnight stay.   Contacts, glasses, dentures or bridgework may not be worn into surgery, please bring cases for these belongings   For patients admitted to the hospital, discharge time will be determined by your treatment team.   Patients discharged the day of surgery will not be allowed to drive home, and someone needs to stay with them for 24 hours.    Special instructions:   [facility]- Preparing For Surgery  Before surgery, you  can play an important role. Because skin is not sterile, your skin needs to be as free of germs as possible. You can reduce the number of germs on your skin by washing with CHG (chlorahexidine gluconate) Soap before surgery.  CHG is an antiseptic cleaner which kills germs and bonds with the skin to continue killing germs even after washing.    Oral Hygiene is also important to reduce your risk of infection.  Remember - BRUSH YOUR TEETH THE MORNING OF SURGERY WITH YOUR REGULAR TOOTHPASTE  Please do not use if you have an allergy to CHG or antibacterial soaps. If your skin becomes reddened/irritated stop using the CHG.  Do not shave (including legs and underarms) for at least 48 hours prior to first CHG shower. It is OK to shave your face.  Please follow these instructions carefully.   1. Shower the NIGHT BEFORE SURGERY and the MORNING OF SURGERY  2. If you chose to wash your hair, wash your hair first as usual with your normal shampoo.  3. After you shampoo, rinse your hair and body thoroughly to remove the shampoo.  4. Wash Face and genitals (private parts) with your normal soap.   5.  Shower the [employer] SURGERY and the  MORNING OF SURGERY with CHG Soap.   6. Use CHG Soap as you would any other liquid soap. You can apply CHG directly to the skin and wash gently with a scrungie or a clean washcloth.   7. Apply the CHG Soap to your body ONLY FROM THE NECK DOWN.  Do not use on open wounds or open sores. Avoid contact with your eyes, ears, mouth and genitals (private parts). Wash Face and genitals (private parts)  with your normal soap.   8. Wash thoroughly, paying special attention to the area where your surgery will be performed.  9. Thoroughly rinse your body with warm water from the neck down.  10. DO NOT shower/wash with your normal soap after using and rinsing off the CHG Soap.  11. Pat yourself dry with a CLEAN TOWEL.  12. Wear CLEAN PAJAMAS to bed the night before  surgery  13. Place CLEAN SHEETS on your bed the night before your surgery  14. DO NOT SLEEP WITH PETS.   Day of Surgery: Take a shower.  Wear Clean/Comfortable clothing the morning of surgery Do not apply any deodorants/lotions.   Remember to brush your teeth WITH YOUR REGULAR TOOTHPASTE.   Please read over the following fact sheets that you were given.

## 2020-11-30 ENCOUNTER — Other Ambulatory Visit: Payer: Self-pay

## 2020-11-30 ENCOUNTER — Ambulatory Visit (HOSPITAL_COMMUNITY)
Admission: RE | Admit: 2020-11-30 | Discharge: 2020-11-30 | Disposition: A | Discharge status: Home / Self Care | Payer: PRIVATE HEALTH INSURANCE | Source: Ambulatory Visit | Attending: Neurosurgery | Admitting: Neurosurgery

## 2020-11-30 DIAGNOSIS — G939 Disorder of brain, unspecified: Secondary | ICD-10-CM | POA: Diagnosis not present

## 2020-11-30 MED ORDER — GADOBUTROL 1 MMOL/ML IV SOLN
6.0000 mL | Freq: Once | INTRAVENOUS | Status: AC | PRN
  Administered 2020-11-30: 6 mL via INTRAVENOUS

## 2020-12-02 ENCOUNTER — Encounter (HOSPITAL_COMMUNITY)
Admission: RE | Admit: 2020-12-02 | Discharge: 2020-12-02 | Disposition: A | Discharge status: Home / Self Care | Payer: PRIVATE HEALTH INSURANCE | Source: Ambulatory Visit | Attending: Neurosurgery | Admitting: Neurosurgery

## 2020-12-02 ENCOUNTER — Encounter (HOSPITAL_COMMUNITY): Payer: Self-pay

## 2020-12-02 ENCOUNTER — Encounter: Payer: Self-pay | Admitting: *Deleted

## 2020-12-02 ENCOUNTER — Other Ambulatory Visit: Payer: Self-pay

## 2020-12-02 ENCOUNTER — Other Ambulatory Visit (HOSPITAL_COMMUNITY)
Admission: RE | Admit: 2020-12-02 | Discharge: 2020-12-02 | Disposition: A | Discharge status: Home / Self Care | Payer: PRIVATE HEALTH INSURANCE | Source: Ambulatory Visit | Attending: Neurosurgery | Admitting: Neurosurgery

## 2020-12-02 DIAGNOSIS — Z01812 Encounter for preprocedural laboratory examination: Secondary | ICD-10-CM | POA: Insufficient documentation

## 2020-12-02 HISTORY — DX: Depression, unspecified: F32.A

## 2020-12-02 HISTORY — DX: Anxiety disorder, unspecified: F41.9

## 2020-12-02 HISTORY — DX: Polycystic ovarian syndrome: E28.2

## 2020-12-02 HISTORY — DX: Headache, unspecified: R51.9

## 2020-12-02 HISTORY — DX: Unspecified asthma, uncomplicated: J45.909

## 2020-12-02 HISTORY — DX: Endometriosis, unspecified: N80.9

## 2020-12-02 LAB — CBC
HCT: 41.4 % (ref 36.0–46.0)
Hemoglobin: 14.1 g/dL (ref 12.0–15.0)
MCH: 33.1 pg (ref 26.0–34.0)
MCHC: 34.1 g/dL (ref 30.0–36.0)
MCV: 97.2 fL (ref 80.0–100.0)
Platelets: 316 10*3/uL (ref 150–400)
RBC: 4.26 MIL/uL (ref 3.87–5.11)
RDW: 12.5 % (ref 11.5–15.5)
WBC: 9 10*3/uL (ref 4.0–10.5)
nRBC: 0 % (ref 0.0–0.2)

## 2020-12-02 LAB — TYPE AND SCREEN
ABO/RH(D): B POS
Antibody Screen: NEGATIVE

## 2020-12-02 LAB — SARS CORONAVIRUS 2 (TAT 6-24 HRS): SARS Coronavirus 2: NEGATIVE

## 2020-12-02 NOTE — Progress Notes (Signed)
PCP - Peggye Pitt, PA-C Cardiologist - Denies  PPM/ICD - Denies  Chest x-ray - N/A EKG - N/A Stress Test - Denies ECHO - Denies Cardiac Cath - Denies  Sleep Study - Denies   Patient denies being diabetic.  Blood Thinner Instructions: N/A Aspirin Instructions: N/A  ERAS Protcol - N/A PRE-SURGERY Ensure or G2- N/A  COVID TEST- 12/02/20   Anesthesia review: No  Patient denies shortness of breath, fever, cough and chest pain at PAT appointment   All instructions explained to the patient, with a verbal understanding of the material. Patient agrees to go over the instructions while at home for a better understanding. Patient also instructed to self quarantine after being tested for COVID-19. The opportunity to ask questions was provided.

## 2020-12-02 NOTE — Pre-Procedure Instructions (Signed)
Surgical Instructions:    Your procedure is scheduled on Wednesday 12/04/20 (07:30 AM- 11:24 AM).  Report to [facility] Main Entrance "A" at 05:30 A.M., then check in with the Admitting office.  Call this number if you have problems the morning of surgery:  [phone]   If you have any questions prior to your surgery date call [phone]: Open Monday-Friday 8am-4pm    Remember:  Do not eat or drink after midnight the night before your surgery.     Take these medicines the morning of surgery with A SIP OF WATER: levETIRAcetam (KEPPRA) norethindrone (MICRONOR)   IF NEEDED: albuterol (VENTOLIN HFA) inhaler- bring with you the day of surgery   As of today, STOP taking any Aspirin (unless otherwise instructed by your surgeon) Aleve, Naproxen, Ibuprofen, Motrin, Advil, Goody's, BC's, all herbal medications, fish oil, and all vitamins.           The Morning of Surgery:            Do not wear jewelry, make up, or nail polish.            Do not wear lotions, powders, perfumes, or deodorant.            Do not shave 48 hours prior to surgery.              Do not bring valuables to the hospital.            [facility] is not responsible for any belongings or valuables.  Do NOT Smoke (Tobacco/Vaping) or drink Alcohol 24 hours prior to your procedure.  If you use a CPAP at night, you may bring all equipment for your overnight stay.   Contacts, glasses, dentures or bridgework may not be worn into surgery, please bring cases for these belongings   For patients admitted to the hospital, discharge time will be determined by your treatment team.   Patients discharged the day of surgery will not be allowed to drive home, and someone needs to stay with them for 24 hours.    Special instructions:   [facility]- Preparing For Surgery  Before surgery, you can play an important role. Because skin is not sterile, your skin needs to be as free of germs as possible. You can reduce the number  of germs on your skin by washing with CHG (chlorahexidine gluconate) Soap before surgery.  CHG is an antiseptic cleaner which kills germs and bonds with the skin to continue killing germs even after washing.    Oral Hygiene is also important to reduce your risk of infection.  Remember - BRUSH YOUR TEETH THE MORNING OF SURGERY WITH YOUR REGULAR TOOTHPASTE  Please do not use if you have an allergy to CHG or antibacterial soaps. If your skin becomes reddened/irritated stop using the CHG.  Do not shave (including legs and underarms) for at least 48 hours prior to first CHG shower. It is OK to shave your face.  Please follow these instructions carefully.   1. Shower the NIGHT BEFORE SURGERY and the MORNING OF SURGERY  2. If you chose to wash your hair, wash your hair first as usual with your normal shampoo.  3. After you shampoo, rinse your hair and body thoroughly to remove the shampoo.  4. Wash Face and genitals (private parts) with your normal soap.   5.  Shower the NIGHT BEFORE SURGERY and the MORNING OF SURGERY with CHG Soap.   6. Use CHG Soap as you would any other  liquid soap. You can apply CHG directly to the skin and wash gently with a scrungie or a clean washcloth.   7. Apply the CHG Soap to your body ONLY FROM THE NECK DOWN.  Do not use on open wounds or open sores. Avoid contact with your eyes, ears, mouth and genitals (private parts). Wash Face and genitals (private parts)  with your normal soap.   8. Wash thoroughly, paying special attention to the area where your surgery will be performed.  9. Thoroughly rinse your body with warm water from the neck down.  10. DO NOT shower/wash with your normal soap after using and rinsing off the CHG Soap.  11. Pat yourself dry with a CLEAN TOWEL.  12. Wear CLEAN PAJAMAS to bed the night before surgery  13. Place CLEAN SHEETS on your bed the night before your surgery  14. DO NOT SLEEP WITH PETS.   Day of Surgery: SHOWER Wear  Clean/Comfortable clothing the morning of surgery Do not apply any deodorants/lotions.   Remember to brush your teeth WITH YOUR REGULAR TOOTHPASTE.   Please read over the following fact sheets that you were given.

## 2020-12-03 ENCOUNTER — Encounter (HOSPITAL_COMMUNITY): Payer: Self-pay | Admitting: Certified Registered Nurse Anesthetist

## 2020-12-04 ENCOUNTER — Encounter (HOSPITAL_COMMUNITY): Payer: Self-pay

## 2020-12-04 ENCOUNTER — Inpatient Hospital Stay (HOSPITAL_COMMUNITY)
Admission: RE | Admit: 2020-12-04 | Discharge: 2020-12-05 | DRG: 027 | Disposition: A | Discharge status: Home / Self Care | Payer: PRIVATE HEALTH INSURANCE | Attending: Neurosurgery | Admitting: Neurosurgery

## 2020-12-04 ENCOUNTER — Other Ambulatory Visit: Payer: Self-pay

## 2020-12-04 ENCOUNTER — Inpatient Hospital Stay (HOSPITAL_COMMUNITY)
Admission: RE | Disposition: A | Discharge status: Home / Self Care | Payer: Self-pay | Source: Home / Self Care | Attending: Neurosurgery

## 2020-12-04 ENCOUNTER — Inpatient Hospital Stay (HOSPITAL_COMMUNITY): Payer: PRIVATE HEALTH INSURANCE | Admitting: Anesthesiology

## 2020-12-04 DIAGNOSIS — Z79899 Other long term (current) drug therapy: Secondary | ICD-10-CM | POA: Diagnosis not present

## 2020-12-04 DIAGNOSIS — Z20822 Contact with and (suspected) exposure to covid-19: Secondary | ICD-10-CM | POA: Diagnosis present

## 2020-12-04 DIAGNOSIS — Z91018 Allergy to other foods: Secondary | ICD-10-CM | POA: Diagnosis not present

## 2020-12-04 DIAGNOSIS — G939 Disorder of brain, unspecified: Secondary | ICD-10-CM | POA: Diagnosis present

## 2020-12-04 DIAGNOSIS — F17200 Nicotine dependence, unspecified, uncomplicated: Secondary | ICD-10-CM | POA: Diagnosis present

## 2020-12-04 DIAGNOSIS — Z791 Long term (current) use of non-steroidal anti-inflammatories (NSAID): Secondary | ICD-10-CM

## 2020-12-04 DIAGNOSIS — F419 Anxiety disorder, unspecified: Secondary | ICD-10-CM | POA: Diagnosis present

## 2020-12-04 DIAGNOSIS — F32A Depression, unspecified: Secondary | ICD-10-CM | POA: Diagnosis present

## 2020-12-04 DIAGNOSIS — J45909 Unspecified asthma, uncomplicated: Secondary | ICD-10-CM | POA: Diagnosis present

## 2020-12-04 DIAGNOSIS — C719 Malignant neoplasm of brain, unspecified: Secondary | ICD-10-CM | POA: Diagnosis present

## 2020-12-04 DIAGNOSIS — C711 Malignant neoplasm of frontal lobe: Secondary | ICD-10-CM | POA: Diagnosis present

## 2020-12-04 DIAGNOSIS — G40909 Epilepsy, unspecified, not intractable, without status epilepticus: Secondary | ICD-10-CM | POA: Diagnosis present

## 2020-12-04 DIAGNOSIS — Z9889 Other specified postprocedural states: Secondary | ICD-10-CM

## 2020-12-04 DIAGNOSIS — E282 Polycystic ovarian syndrome: Secondary | ICD-10-CM | POA: Diagnosis present

## 2020-12-04 HISTORY — PX: APPLICATION OF CRANIAL NAVIGATION: SHX6578

## 2020-12-04 HISTORY — PX: CRANIOTOMY: SHX93

## 2020-12-04 LAB — POCT I-STAT, CHEM 8
BUN: 7 mg/dL (ref 6–20)
Calcium, Ion: 1.19 mmol/L (ref 1.15–1.40)
Chloride: 107 mmol/L (ref 98–111)
Creatinine, Ser: 0.5 mg/dL (ref 0.44–1.00)
Glucose, Bld: 151 mg/dL — ABNORMAL HIGH (ref 70–99)
HCT: 35 % — ABNORMAL LOW (ref 36.0–46.0)
Hemoglobin: 11.9 g/dL — ABNORMAL LOW (ref 12.0–15.0)
Potassium: 3.2 mmol/L — ABNORMAL LOW (ref 3.5–5.1)
Sodium: 140 mmol/L (ref 135–145)
TCO2: 24 mmol/L (ref 22–32)

## 2020-12-04 LAB — MRSA PCR SCREENING: MRSA by PCR: NEGATIVE

## 2020-12-04 LAB — POCT PREGNANCY, URINE: Preg Test, Ur: NEGATIVE

## 2020-12-04 SURGERY — CRANIOTOMY TUMOR EXCISION
Anesthesia: General | Site: Head | Laterality: Left

## 2020-12-04 MED ORDER — THROMBIN 20000 UNITS EX SOLR
CUTANEOUS | Status: AC
  Filled 2020-12-04: qty 20000

## 2020-12-04 MED ORDER — ONDANSETRON HCL 4 MG/2ML IJ SOLN
INTRAMUSCULAR | Status: AC
  Filled 2020-12-04: qty 2

## 2020-12-04 MED ORDER — LACTATED RINGERS IV SOLN: INTRAVENOUS | Status: DC

## 2020-12-04 MED ORDER — THROMBIN 5000 UNITS EX SOLR
CUTANEOUS | Status: AC
  Filled 2020-12-04: qty 5000

## 2020-12-04 MED ORDER — EPHEDRINE 5 MG/ML INJ
INTRAVENOUS | Status: AC
  Filled 2020-12-04: qty 10

## 2020-12-04 MED ORDER — BACITRACIN ZINC 500 UNIT/GM EX OINT
TOPICAL_OINTMENT | CUTANEOUS | Status: DC | PRN
  Administered 2020-12-04 (×2): 1 via TOPICAL

## 2020-12-04 MED ORDER — CHLORHEXIDINE GLUCONATE CLOTH 2 % EX PADS
6.0000 | MEDICATED_PAD | Freq: Every day | CUTANEOUS | Status: DC
  Administered 2020-12-04: 6 via TOPICAL

## 2020-12-04 MED ORDER — SODIUM CHLORIDE 0.9 % IV SOLN
0.0125 ug/kg/min | INTRAVENOUS | Status: AC
  Administered 2020-12-04: .2 ug/kg/min via INTRAVENOUS
  Filled 2020-12-04: qty 2000

## 2020-12-04 MED ORDER — ACETAMINOPHEN 10 MG/ML IV SOLN: 1000.0000 mg | Freq: Once | INTRAVENOUS | Status: DC | PRN

## 2020-12-04 MED ORDER — ALBUTEROL SULFATE (2.5 MG/3ML) 0.083% IN NEBU: 3.0000 mL | INHALATION_SOLUTION | RESPIRATORY_TRACT | Status: DC | PRN

## 2020-12-04 MED ORDER — CHLORHEXIDINE GLUCONATE 0.12 % MT SOLN
15.0000 mL | Freq: Once | OROMUCOSAL | Status: AC
  Administered 2020-12-04: 15 mL via OROMUCOSAL
  Filled 2020-12-04: qty 15

## 2020-12-04 MED ORDER — FENTANYL CITRATE (PF) 250 MCG/5ML IJ SOLN
INTRAMUSCULAR | Status: AC
  Filled 2020-12-04: qty 5

## 2020-12-04 MED ORDER — DOCUSATE SODIUM 100 MG PO CAPS
100.0000 mg | ORAL_CAPSULE | Freq: Two times a day (BID) | ORAL | Status: DC
  Administered 2020-12-04: 100 mg via ORAL
  Filled 2020-12-04: qty 1

## 2020-12-04 MED ORDER — DEXAMETHASONE SODIUM PHOSPHATE 10 MG/ML IJ SOLN
INTRAMUSCULAR | Status: AC
  Filled 2020-12-04: qty 1

## 2020-12-04 MED ORDER — ACETAMINOPHEN 650 MG RE SUPP: 650.0000 mg | RECTAL | Status: DC | PRN

## 2020-12-04 MED ORDER — PROPOFOL 10 MG/ML IV BOLUS
INTRAVENOUS | Status: AC
  Filled 2020-12-04: qty 40

## 2020-12-04 MED ORDER — MIDAZOLAM HCL 2 MG/2ML IJ SOLN
INTRAMUSCULAR | Status: AC
  Filled 2020-12-04: qty 2

## 2020-12-04 MED ORDER — PHENYLEPHRINE 40 MCG/ML (10ML) SYRINGE FOR IV PUSH (FOR BLOOD PRESSURE SUPPORT)
PREFILLED_SYRINGE | INTRAVENOUS | Status: DC | PRN
  Administered 2020-12-04: 40 ug via INTRAVENOUS

## 2020-12-04 MED ORDER — THROMBIN 5000 UNITS EX SOLR: OROMUCOSAL | Status: DC | PRN

## 2020-12-04 MED ORDER — PHENYLEPHRINE 40 MCG/ML (10ML) SYRINGE FOR IV PUSH (FOR BLOOD PRESSURE SUPPORT)
PREFILLED_SYRINGE | INTRAVENOUS | Status: AC
  Filled 2020-12-04: qty 10

## 2020-12-04 MED ORDER — PROPOFOL 10 MG/ML IV BOLUS
INTRAVENOUS | Status: DC | PRN
  Administered 2020-12-04: 40 mg via INTRAVENOUS
  Administered 2020-12-04: 30 mg via INTRAVENOUS
  Administered 2020-12-04: 110 mg via INTRAVENOUS
  Administered 2020-12-04: 20 mg via INTRAVENOUS

## 2020-12-04 MED ORDER — SUGAMMADEX SODIUM 200 MG/2ML IV SOLN
INTRAVENOUS | Status: DC | PRN
  Administered 2020-12-04: 150 mg via INTRAVENOUS

## 2020-12-04 MED ORDER — 0.9 % SODIUM CHLORIDE (POUR BTL) OPTIME
TOPICAL | Status: DC | PRN
  Administered 2020-12-04 (×3): 1000 mL

## 2020-12-04 MED ORDER — THROMBIN 20000 UNITS EX SOLR: CUTANEOUS | Status: DC | PRN

## 2020-12-04 MED ORDER — ACETAMINOPHEN 160 MG/5ML PO SOLN: 325.0000 mg | Freq: Once | ORAL | Status: DC | PRN

## 2020-12-04 MED ORDER — DEXAMETHASONE 4 MG PO TABS: 2.0000 mg | ORAL_TABLET | Freq: Three times a day (TID) | ORAL | Status: DC

## 2020-12-04 MED ORDER — ESMOLOL HCL 100 MG/10ML IV SOLN
INTRAVENOUS | Status: AC
  Filled 2020-12-04: qty 10

## 2020-12-04 MED ORDER — DEXAMETHASONE 0.5 MG PO TABS: 1.0000 mg | ORAL_TABLET | Freq: Two times a day (BID) | ORAL | Status: DC

## 2020-12-04 MED ORDER — LABETALOL HCL 5 MG/ML IV SOLN: 10.0000 mg | INTRAVENOUS | Status: DC | PRN

## 2020-12-04 MED ORDER — FENTANYL CITRATE (PF) 250 MCG/5ML IJ SOLN
INTRAMUSCULAR | Status: DC | PRN
  Administered 2020-12-04 (×3): 50 ug via INTRAVENOUS
  Administered 2020-12-04: 100 ug via INTRAVENOUS

## 2020-12-04 MED ORDER — LIDOCAINE 2% (20 MG/ML) 5 ML SYRINGE
INTRAMUSCULAR | Status: AC
  Filled 2020-12-04: qty 5

## 2020-12-04 MED ORDER — POLYETHYLENE GLYCOL 3350 17 G PO PACK: 17.0000 g | PACK | Freq: Every day | ORAL | Status: DC | PRN

## 2020-12-04 MED ORDER — ORAL CARE MOUTH RINSE: 15.0000 mL | Freq: Once | OROMUCOSAL | Status: AC

## 2020-12-04 MED ORDER — MORPHINE SULFATE (PF) 2 MG/ML IV SOLN
INTRAVENOUS | Status: AC
  Administered 2020-12-04: 2 mg via INTRAVENOUS
  Filled 2020-12-04: qty 1

## 2020-12-04 MED ORDER — LEVETIRACETAM 750 MG PO TABS
750.0000 mg | ORAL_TABLET | Freq: Two times a day (BID) | ORAL | Status: DC
Start: 2020-12-04 — End: 2020-12-05
  Administered 2020-12-04 – 2020-12-05 (×2): 750 mg via ORAL
  Filled 2020-12-04 (×2): qty 1

## 2020-12-04 MED ORDER — MORPHINE SULFATE (PF) 2 MG/ML IV SOLN
1.0000 mg | INTRAVENOUS | Status: DC | PRN
  Administered 2020-12-04 – 2020-12-05 (×4): 2 mg via INTRAVENOUS
  Filled 2020-12-04 (×5): qty 1

## 2020-12-04 MED ORDER — AMISULPRIDE (ANTIEMETIC) 5 MG/2ML IV SOLN: 10.0000 mg | Freq: Once | INTRAVENOUS | Status: DC | PRN

## 2020-12-04 MED ORDER — SODIUM CHLORIDE 0.9 % IV SOLN: INTRAVENOUS | Status: DC | PRN

## 2020-12-04 MED ORDER — NORETHINDRONE 0.35 MG PO TABS: 1.0000 | ORAL_TABLET | Freq: Every day | ORAL | Status: DC

## 2020-12-04 MED ORDER — FLEET ENEMA 7-19 GM/118ML RE ENEM: 1.0000 | ENEMA | Freq: Once | RECTAL | Status: DC | PRN

## 2020-12-04 MED ORDER — DEXAMETHASONE SODIUM PHOSPHATE 10 MG/ML IJ SOLN
INTRAMUSCULAR | Status: DC | PRN
  Administered 2020-12-04: 10 mg via INTRAVENOUS

## 2020-12-04 MED ORDER — HYDROMORPHONE HCL 1 MG/ML IJ SOLN: 0.2500 mg | INTRAMUSCULAR | Status: DC | PRN

## 2020-12-04 MED ORDER — ROCURONIUM BROMIDE 10 MG/ML (PF) SYRINGE
PREFILLED_SYRINGE | INTRAVENOUS | Status: AC
  Filled 2020-12-04: qty 10

## 2020-12-04 MED ORDER — LIDOCAINE-EPINEPHRINE 1 %-1:100000 IJ SOLN
INTRAMUSCULAR | Status: DC | PRN
  Administered 2020-12-04: 10 mL

## 2020-12-04 MED ORDER — ESMOLOL HCL 100 MG/10ML IV SOLN
INTRAVENOUS | Status: DC | PRN
  Administered 2020-12-04: 40 mg via INTRAVENOUS

## 2020-12-04 MED ORDER — ONDANSETRON HCL 4 MG/2ML IJ SOLN: 4.0000 mg | INTRAMUSCULAR | Status: DC | PRN

## 2020-12-04 MED ORDER — CEFAZOLIN SODIUM-DEXTROSE 2-3 GM-%(50ML) IV SOLR
INTRAVENOUS | Status: DC | PRN
  Administered 2020-12-04 (×2): 2 g via INTRAVENOUS

## 2020-12-04 MED ORDER — LIDOCAINE-EPINEPHRINE 1 %-1:100000 IJ SOLN
INTRAMUSCULAR | Status: AC
  Filled 2020-12-04: qty 1

## 2020-12-04 MED ORDER — DEXAMETHASONE 4 MG PO TABS: 2.0000 mg | ORAL_TABLET | Freq: Two times a day (BID) | ORAL | Status: DC

## 2020-12-04 MED ORDER — ONDANSETRON HCL 4 MG PO TABS: 4.0000 mg | ORAL_TABLET | ORAL | Status: DC | PRN

## 2020-12-04 MED ORDER — PHENYLEPHRINE HCL-NACL 10-0.9 MG/250ML-% IV SOLN
INTRAVENOUS | Status: DC | PRN
  Administered 2020-12-04: 25 ug/min via INTRAVENOUS

## 2020-12-04 MED ORDER — CEFAZOLIN SODIUM 1 G IJ SOLR
INTRAMUSCULAR | Status: AC
  Filled 2020-12-04: qty 20

## 2020-12-04 MED ORDER — MEPERIDINE HCL 25 MG/ML IJ SOLN: 6.2500 mg | INTRAMUSCULAR | Status: DC | PRN

## 2020-12-04 MED ORDER — ENALAPRILAT 1.25 MG/ML IV SOLN: 1.2500 mg | Freq: Four times a day (QID) | INTRAVENOUS | Status: DC | PRN

## 2020-12-04 MED ORDER — DEXAMETHASONE 4 MG PO TABS
4.0000 mg | ORAL_TABLET | Freq: Three times a day (TID) | ORAL | Status: DC
  Administered 2020-12-04 – 2020-12-05 (×4): 4 mg via ORAL
  Filled 2020-12-04 (×4): qty 1

## 2020-12-04 MED ORDER — ROCURONIUM BROMIDE 10 MG/ML (PF) SYRINGE
PREFILLED_SYRINGE | INTRAVENOUS | Status: DC | PRN
  Administered 2020-12-04 (×2): 40 mg via INTRAVENOUS
  Administered 2020-12-04 (×2): 50 mg via INTRAVENOUS

## 2020-12-04 MED ORDER — ACETAMINOPHEN 325 MG PO TABS
650.0000 mg | ORAL_TABLET | ORAL | Status: DC | PRN
Start: 2020-12-04 — End: 2020-12-05

## 2020-12-04 MED ORDER — PROMETHAZINE HCL 25 MG PO TABS: 12.5000 mg | ORAL_TABLET | ORAL | Status: DC | PRN

## 2020-12-04 MED ORDER — BACITRACIN ZINC 500 UNIT/GM EX OINT
TOPICAL_OINTMENT | CUTANEOUS | Status: AC
  Filled 2020-12-04: qty 28.35

## 2020-12-04 MED ORDER — ENOXAPARIN SODIUM 40 MG/0.4ML ~~LOC~~ SOLN: 40.0000 mg | SUBCUTANEOUS | Status: DC

## 2020-12-04 MED ORDER — EPHEDRINE SULFATE-NACL 50-0.9 MG/10ML-% IV SOSY
PREFILLED_SYRINGE | INTRAVENOUS | Status: DC | PRN
  Administered 2020-12-04 (×2): 5 mg via INTRAVENOUS

## 2020-12-04 MED ORDER — PANTOPRAZOLE SODIUM 40 MG PO TBEC
40.0000 mg | DELAYED_RELEASE_TABLET | Freq: Every day | ORAL | Status: DC
  Administered 2020-12-04 – 2020-12-05 (×2): 40 mg via ORAL
  Filled 2020-12-04 (×2): qty 1

## 2020-12-04 MED ORDER — ONDANSETRON HCL 4 MG/2ML IJ SOLN
INTRAMUSCULAR | Status: DC | PRN
  Administered 2020-12-04: 4 mg via INTRAVENOUS

## 2020-12-04 MED ORDER — CEFAZOLIN SODIUM-DEXTROSE 2-4 GM/100ML-% IV SOLN
INTRAVENOUS | Status: AC
  Filled 2020-12-04: qty 100

## 2020-12-04 MED ORDER — ACETAMINOPHEN 325 MG PO TABS
325.0000 mg | ORAL_TABLET | Freq: Once | ORAL | Status: DC | PRN
Start: 2020-12-04 — End: 2020-12-04

## 2020-12-04 MED ORDER — NICARDIPINE HCL IN NACL 20-0.86 MG/200ML-% IV SOLN: 3.0000 mg/h | INTRAVENOUS | Status: DC

## 2020-12-04 MED ORDER — HYDROCODONE-ACETAMINOPHEN 5-325 MG PO TABS
1.0000 | ORAL_TABLET | ORAL | Status: DC | PRN
  Administered 2020-12-04 – 2020-12-05 (×5): 1 via ORAL
  Filled 2020-12-04 (×5): qty 1

## 2020-12-04 MED ORDER — DEXAMETHASONE 0.5 MG PO TABS: 1.0000 mg | ORAL_TABLET | Freq: Every day | ORAL | Status: DC

## 2020-12-04 MED ORDER — CEFAZOLIN SODIUM-DEXTROSE 1-4 GM/50ML-% IV SOLN
1.0000 g | Freq: Three times a day (TID) | INTRAVENOUS | Status: AC
  Administered 2020-12-04 – 2020-12-05 (×3): 1 g via INTRAVENOUS
  Filled 2020-12-04 (×3): qty 50

## 2020-12-04 MED ORDER — POTASSIUM CHLORIDE IN NACL 20-0.9 MEQ/L-% IV SOLN
INTRAVENOUS | Status: DC
  Filled 2020-12-04 (×2): qty 1000

## 2020-12-04 MED ORDER — LIDOCAINE 2% (20 MG/ML) 5 ML SYRINGE
INTRAMUSCULAR | Status: DC | PRN
  Administered 2020-12-04 (×2): 50 mg via INTRAVENOUS

## 2020-12-04 SURGICAL SUPPLY — 97 items
BAND RUBBER #18 3X1/16 STRL (MISCELLANEOUS) ×6 IMPLANT
BENZOIN TINCTURE PRP APPL 2/3 (GAUZE/BANDAGES/DRESSINGS) IMPLANT
BLADE CLIPPER SURG (BLADE) ×3 IMPLANT
BLADE SAW GIGLI 16 STRL (MISCELLANEOUS) IMPLANT
BLADE SURG 11 STRL SS (BLADE) ×3 IMPLANT
BLADE SURG 15 STRL LF DISP TIS (BLADE) IMPLANT
BLADE SURG 15 STRL SS (BLADE)
BNDG GAUZE ELAST 4 BULKY (GAUZE/BANDAGES/DRESSINGS) ×6 IMPLANT
BNDG STRETCH 4X75 STRL LF (GAUZE/BANDAGES/DRESSINGS) ×6 IMPLANT
BUR ACORN 9.0 PRECISION (BURR) ×3 IMPLANT
BUR LG DIAMOND 1.0MM (BURR) ×12 IMPLANT
BUR ROUND FLUTED 4 SOFT TCH (BURR) IMPLANT
BUR SPIRAL ROUTER 2.3 (BUR) ×3 IMPLANT
CANISTER SUCT 3000ML PPV (MISCELLANEOUS) ×6 IMPLANT
CATH VENTRIC 35X38 W/TROCAR LG (CATHETERS) IMPLANT
CLIP VESOCCLUDE MED 6/CT (CLIP) IMPLANT
CNTNR URN SCR LID CUP LEK RST (MISCELLANEOUS) ×2 IMPLANT
CONT SPEC 4OZ STRL OR WHT (MISCELLANEOUS) ×3
COVER BURR HOLE UNIV 10 (Orthopedic Implant) ×9 IMPLANT
COVER MAYO STAND STRL (DRAPES) IMPLANT
COVER WAND RF STERILE (DRAPES) IMPLANT
DECANTER SPIKE VIAL GLASS SM (MISCELLANEOUS) IMPLANT
DRAIN SUBARACHNOID (WOUND CARE) IMPLANT
DRAPE HALF SHEET 40X57 (DRAPES) ×3 IMPLANT
DRAPE MICROSCOPE LEICA (MISCELLANEOUS) ×3 IMPLANT
DRAPE NEUROLOGICAL W/INCISE (DRAPES) ×3 IMPLANT
DRAPE STERI IOBAN 125X83 (DRAPES) IMPLANT
DRAPE SURG 17X23 STRL (DRAPES) IMPLANT
DRAPE WARM FLUID 44X44 (DRAPES) ×3 IMPLANT
DRSG ADAPTIC 3X8 NADH LF (GAUZE/BANDAGES/DRESSINGS) IMPLANT
DRSG AQUACEL AG ADV 3.5X 6 (GAUZE/BANDAGES/DRESSINGS) IMPLANT
DRSG TELFA 3X8 NADH (GAUZE/BANDAGES/DRESSINGS) IMPLANT
DURAPREP 6ML APPLICATOR 50/CS (WOUND CARE) ×3 IMPLANT
ELECT COATED BLADE 2.86 ST (ELECTRODE) ×3 IMPLANT
ELECT REM PT RETURN 9FT ADLT (ELECTROSURGICAL) ×3
ELECTRODE REM PT RTRN 9FT ADLT (ELECTROSURGICAL) ×2 IMPLANT
EVACUATOR 1/8 PVC DRAIN (DRAIN) IMPLANT
EVACUATOR SILICONE 100CC (DRAIN) IMPLANT
FORCEPS BIPO MALIS IRRIG 9X1.5 (NEUROSURGERY SUPPLIES) ×3 IMPLANT
GAUZE 4X4 16PLY RFD (DISPOSABLE) IMPLANT
GAUZE SPONGE 4X4 12PLY STRL (GAUZE/BANDAGES/DRESSINGS) ×3 IMPLANT
GAUZE XEROFORM 1X8 LF (GAUZE/BANDAGES/DRESSINGS) IMPLANT
GLOVE BIO SURGEON STRL SZ7.5 (GLOVE) ×6 IMPLANT
GLOVE BIOGEL PI IND STRL 7.5 (GLOVE) ×4 IMPLANT
GLOVE BIOGEL PI INDICATOR 7.5 (GLOVE) ×2
GLOVE ECLIPSE 7.5 STRL STRAW (GLOVE) ×12 IMPLANT
GLOVE ECLIPSE 8.5 STRL (GLOVE) ×3 IMPLANT
GLOVE EXAM NITRILE LRG STRL (GLOVE) IMPLANT
GLOVE EXAM NITRILE XL STR (GLOVE) IMPLANT
GLOVE SURG POLYISO LF SZ7 (GLOVE) ×9 IMPLANT
GLOVE SURG UNDER POLY LF SZ7.5 (GLOVE) ×9 IMPLANT
GOWN STRL REUS W/ TWL LRG LVL3 (GOWN DISPOSABLE) ×6 IMPLANT
GOWN STRL REUS W/ TWL XL LVL3 (GOWN DISPOSABLE) IMPLANT
GOWN STRL REUS W/TWL 2XL LVL3 (GOWN DISPOSABLE) IMPLANT
GOWN STRL REUS W/TWL LRG LVL3 (GOWN DISPOSABLE) ×9
GOWN STRL REUS W/TWL XL LVL3 (GOWN DISPOSABLE)
GRAFT DURAGEN MATRIX 2WX2L ×3 IMPLANT
HEMOSTAT POWDER KIT SURGIFOAM (HEMOSTASIS) ×3 IMPLANT
HEMOSTAT SURGICEL 2X14 (HEMOSTASIS) ×6 IMPLANT
HOOK DURA 1/2IN (MISCELLANEOUS) IMPLANT
IV NS 1000ML (IV SOLUTION) ×3
IV NS 1000ML BAXH (IV SOLUTION) ×2 IMPLANT
KIT BASIN OR (CUSTOM PROCEDURE TRAY) ×3 IMPLANT
KIT DRAIN CSF ACCUDRAIN (MISCELLANEOUS) IMPLANT
KIT TURNOVER KIT B (KITS) ×3 IMPLANT
MARKER SPHERE PSV REFLC 13MM (MARKER) ×6 IMPLANT
NEEDLE HYPO 22GX1.5 SAFETY (NEEDLE) ×3 IMPLANT
NEEDLE SPNL 18GX3.5 QUINCKE PK (NEEDLE) IMPLANT
NS IRRIG 1000ML POUR BTL (IV SOLUTION) ×9 IMPLANT
PACK CRANIOTOMY CUSTOM (CUSTOM PROCEDURE TRAY) ×3 IMPLANT
PATTIES SURGICAL .25X.25 (GAUZE/BANDAGES/DRESSINGS) IMPLANT
PATTIES SURGICAL .5 X.5 (GAUZE/BANDAGES/DRESSINGS) IMPLANT
PATTIES SURGICAL .5 X3 (DISPOSABLE) ×3 IMPLANT
PATTIES SURGICAL 1/4 X 3 (GAUZE/BANDAGES/DRESSINGS) IMPLANT
PATTIES SURGICAL 1X1 (DISPOSABLE) IMPLANT
PIN MAYFIELD SKULL DISP (PIN) ×3 IMPLANT
PLATE BONE 12 2H TARGET XL (Plate) ×3 IMPLANT
SCREW UNIII AXS SD 1.5X4 (Screw) ×36 IMPLANT
SET TUBING IRRIGATION DISP (TUBING) ×3 IMPLANT
SPONGE NEURO XRAY DETECT 1X3 (DISPOSABLE) IMPLANT
SPONGE SURGIFOAM ABS GEL 100 (HEMOSTASIS) ×6 IMPLANT
STAPLER VISISTAT 35W (STAPLE) ×3 IMPLANT
STOCKINETTE 6  STRL (DRAPES) ×3
STOCKINETTE 6 STRL (DRAPES) ×2 IMPLANT
SUT ETHILON 3 0 FSL (SUTURE) IMPLANT
SUT ETHILON 3 0 PS 1 (SUTURE) IMPLANT
SUT MNCRL AB 3-0 PS2 18 (SUTURE) ×3 IMPLANT
SUT NURALON 4 0 TR CR/8 (SUTURE) ×6 IMPLANT
SUT SILK 0 TIES 10X30 (SUTURE) IMPLANT
SUT VIC AB 2-0 CP2 18 (SUTURE) ×6 IMPLANT
SUT VICRYL RAPIDE 3 0 (SUTURE) ×3 IMPLANT
TOWEL GREEN STERILE (TOWEL DISPOSABLE) ×3 IMPLANT
TOWEL GREEN STERILE FF (TOWEL DISPOSABLE) ×3 IMPLANT
TRAY FOLEY MTR SLVR 16FR STAT (SET/KITS/TRAYS/PACK) ×3 IMPLANT
TUBE CONNECTING 12X1/4 (SUCTIONS) ×3 IMPLANT
UNDERPAD 30X36 HEAVY ABSORB (UNDERPADS AND DIAPERS) ×3 IMPLANT
WATER STERILE IRR 1000ML POUR (IV SOLUTION) ×3 IMPLANT

## 2020-12-04 NOTE — Anesthesia Procedure Notes (Addendum)
Procedure Name: Intubation Date/Time: 12/04/2020 7:57 AM Performed by: Harden Mo, CRNA Pre-anesthesia Checklist: Patient identified, Emergency Drugs available, Suction available and Patient being monitored Patient Re-evaluated:Patient Re-evaluated prior to induction Oxygen Delivery Method: Circle System Utilized Preoxygenation: Pre-oxygenation with 100% oxygen Induction Type: IV induction Ventilation: Mask ventilation without difficulty Laryngoscope Size: Mac and 3 Grade View: Grade I Tube type: Oral Tube size: 7.0 mm Number of attempts: 1 Airway Equipment and Method: Stylet and Oral airway Placement Confirmation: ETT inserted through vocal cords under direct vision,  positive ETCO2 and breath sounds checked- equal and bilateral Secured at: 20 cm Tube secured with: Tape Dental Injury: Teeth and Oropharynx as per pre-operative assessment  Comments: Atraumatic oral intubation by Dorene Grebe, SRNA.

## 2020-12-04 NOTE — Anesthesia Procedure Notes (Addendum)
Arterial Line Insertion Start/End2/23/2022 8:00 AM, 12/04/2020 8:05 AM Performed by: Effie Berkshire, MD, anesthesiologist  Patient location: Pre-op. Preanesthetic checklist: patient identified, IV checked, site marked, risks and benefits discussed, surgical consent, monitors and equipment checked, pre-op evaluation, timeout performed and anesthesia consent Lidocaine 1% used for infiltration Right, radial was placed Catheter size: 20 Fr Hand hygiene performed  and maximum sterile barriers used   Attempts: 1 Procedure performed without using ultrasound guided technique. Following insertion, dressing applied. Post procedure assessment: normal and unchanged  Patient tolerated the procedure well with no immediate complications.

## 2020-12-04 NOTE — H&P (Signed)
This 30 year old female presents for Seizures.  HISTORY OF PRESENT ILLNESS:   1. Seizures This is a 30 year old woman who presents for evaluation of brain lesions. She had no past medical history but this past fall, she began developing nocturnal grand mall seizures. She had 1 episode of a focal seizure while she was watching TV when she suddenly was unable to understand what was going on on the program and she became very confused, which normalized after several minutes. She is right-handed. She was initially started on Keppra 500 milligrams, but this was increased to 750 milligrams. Since the increase, she has not had any seizures. An MRI brain was performed and showed a left superior frontal gyrus region nonenhancing lesion as well as a smaller right anterior insular lesion. Patient does not recall if she had an EEG.  Past Medical History:  Diagnosis Date  . Anxiety   . Asthma    ALLERGY INDUCED  . Depression   . Endometriosis   . Headache    MIGRAINES  . PCOS (polycystic ovarian syndrome)    Past Surgical History:  Procedure Laterality Date  . APPENDECTOMY  09/2010  . ROBOTIC ASSISTED LAPAROSCOPIC OVARIAN CYSTECTOMY Bilateral 12/12/2018   Procedure: XI ROBOTIC ASSISTED LAPAROSCOPIC OVARIAN CYSTECTOMY, CONSERVATIVE TREATMENT OF ENDOMETRIOSIS;  Surgeon: [name], MD;  Location: [facility];  Service: Gynecology;  Laterality: Bilateral;   No current facility-administered medications on file prior to encounter.   Current Outpatient Medications on File Prior to Encounter  Medication Sig Dispense Refill  . albuterol (VENTOLIN HFA) 108 (90 Base) MCG/ACT inhaler Inhale 1 puff into the lungs every 4 (four) hours as needed for shortness of breath.    Marland Kitchen ibuprofen (ADVIL) 200 MG tablet Take 400 mg by mouth every 6 (six) hours as needed for moderate pain.    Marland Kitchen levETIRAcetam (KEPPRA) 500 MG tablet Take 1.5 tablet twice a day (Patient taking differently: Take 750 mg by mouth  2 (two) times daily.) 270 tablet 3  . fluticasone (FLONASE) 50 MCG/ACT nasal spray Place 1 spray into both nostrils daily as needed for allergies or rhinitis. (Patient not taking: Reported on 11/27/2020)    . loratadine (CLARITIN) 10 MG tablet Take 10 mg by mouth daily as needed for allergies.  (Patient not taking: Reported on 11/27/2020)    . naproxen sodium (ALEVE) 220 MG tablet Take 660 mg by mouth as needed (pain). (Patient not taking: Reported on 11/27/2020)     Review of Systems  All other systems reviewed and are negative.       PHYSICAL EXAM Details BP 111/70   Pulse 64   Temp (!) 97.5 F (36.4 C) (Oral)   Resp 18   Ht 5\' 5"  (1.651 m)   Wt 60.2 kg   LMP 11/12/2020 (Approximate)   SpO2 99%   BMI 22.10 kg/m   General Level of Distress: Overall Appearance: Head and Face Fundoscopic Exam: Cardiovascular no acute distress N ormal Right unable to visualize unable to visualize Lyric, Rossano [id number] 17-Jul-1991 11/08/2020 10:30 AM Page: 2/6  Peripheral Pulses: Carotid Pulses: Respiratory Lungs: Neurological Right normal non-labored Left normal normal Orientation: Recent and Remote Memory: Attention Span and Concentration: Language: Fund of Knowledge: Sensation: Upper Extremity Coordination: Lower  Extremity Coordination: Musculoskeletal Gait and Station: Upper Extremity Muscle Tone: Lower Extremity Muscle Tone: Motor Strength normal normal normal normal normal Right normal normal normal normal Right normal normal Left normal normal normal Left . Any abnormal findings will be noted below.  Right Deltoid: 5/5 Biceps: 5/5 Triceps: 5/5 Wrist Extensor: 5/5 Grip: 5/5 Finger Extensor: 5/5 Hip Fl exor: 5/5 Knee Extensor: 5/5 Knee Flexor: 5/5 Tib Anterior: 5/5 EHL: 5/5 Medial Gastroc: 5/5 Gaze Normal Horizontal Gaze Stability: Deep Tendon Reflexes Right normal Left 5/5 5/5 5/5 5/5 5/5 5/5 5/5 5/5 5/5 5/5 5/5 5/5 Left Biceps: 2+ Vashti, Bolanos  [id number] Aug 16, 1991 11/08/2020 10:30 AM Page: 3/6 2+  Brachioradialis: Patellar: Achilles: Sensory 2+ 2+ 2+ 2+ 2+ 2+ Sensation was tested a t C2 to T1 and L1 to S1. Cranial Nerves II. Optic Nerve/Visual Fields: III. Oculomotor: IV. Trochlear: V. Trigeminal: VI. Abducens: VII. Facial: VIII. Acoustic/Vestibular: IX. Glossopharyngeal: X. Vagus: XI. Spinal Accessory: XII. Hypoglossal: DIAGNOSTIC RESULTS: normal EOMI EOMI sensory intact EOMI no facial droop hearing intact palate elevation symmetric no hoarseness shoulder shrug f ull tongue protrusion midline   CTheadandMRIwerereviewed. Thereisanonenhancinglesionofthesuperiorfrontalgyrusalongitsmedialandinferior portion, adjacent to the cingulate sulcus. It appears expansile. There is also a less apparent expansile lesioning involving the right anterior insula, involving the short gyri. Her most recent MRI is stable compared with her MRI from October.  IMPRESSION: This is a 30 year old woman with newlydiagnosed epilepsy who was found to have multiple brain lesions. 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 PLAN: - craniotomy for resective biopsy of lesion -risks, benefits, alternatives, and expected convalescence discussed.  I discussed in detail the procedure with her.  Possibility of craniectomy, ventricular or lumbar drain placement, and central line placement was also discussed. Risks, benefits, alternatives, and expected convalescence was discussed.  Risks discussed included but were not limited to bleeding, pain, infection, seizure, stroke, scar, recurrence, cerebrospinal fluid leak, neurologic deficit, paralysis, coma and death.  I also discussed with her the possibility of blood transfusion during surgery and consent to transfuse blood products if necessary was also obtained.  All questions  and concerns were answered and understanding and agreement with the plan was verbalized.

## 2020-12-04 NOTE — Anesthesia Preprocedure Evaluation (Addendum)
Anesthesia Evaluation  Patient identified by MRN, date of birth, ID band Patient awake    Reviewed: Allergy & Precautions, NPO status , Patient's Chart, lab work & pertinent test results  Airway Mallampati: I  TM Distance: >3 FB Neck ROM: Full    Dental  (+) Teeth Intact, Dental Advisory Given   Pulmonary asthma , Current Smoker and Patient abstained from smoking.,    breath sounds clear to auscultation       Cardiovascular negative cardio ROS   Rhythm:Regular Rate:Normal     Neuro/Psych  Headaches, PSYCHIATRIC DISORDERS Anxiety Depression    GI/Hepatic negative GI ROS, Neg liver ROS,   Endo/Other  negative endocrine ROS  Renal/GU negative Renal ROS     Musculoskeletal negative musculoskeletal ROS (+)   Abdominal Normal abdominal exam  (+)   Peds  Hematology negative hematology ROS (+)   Anesthesia Other Findings   Reproductive/Obstetrics                            Anesthesia Physical Anesthesia Plan  ASA: II  Anesthesia Plan: General   Post-op Pain Management:    Induction: Intravenous  PONV Risk Score and Plan: 3 and Ondansetron, Dexamethasone and Midazolam  Airway Management Planned: Oral ETT  Additional Equipment: Arterial line  Intra-op Plan:   Post-operative Plan: Extubation in OR  Informed Consent: I have reviewed the patients History and Physical, chart, labs and discussed the procedure including the risks, benefits and alternatives for the proposed anesthesia with the patient or authorized representative who has indicated his/her understanding and acceptance.     Dental advisory given  Plan Discussed with: CRNA  Anesthesia Plan Comments:        Anesthesia Quick Evaluation

## 2020-12-04 NOTE — Progress Notes (Signed)
This 30 year old female presents for Seizures. HISTORY OF PRESENT ILLNESS:  1. Seizures This is a 30 year old woman who presents for evaluation of brain lesions. She had no past medical history but this past fall, she began developing nocturnal grand mall seizures. She had 1 episode of a focal seizure while she was watching TV when she suddenly was unable to understand what was going on on the program and she became very confus ed, which normalized after several minutes. She is right-handed. She was initially started on Keppra 500 milligrams, but this was increased to 750 milligrams. Since the increase, she has not had any seizures. An MRI brain was performed and showed a left superior frontal gyrus region nonenhancing lesion as well as a smaller right anterior insular lesion. Patient does not recall if she had an EEG. PAST MEDICAL/SURGICAL HISTORY: Fami ly History: (Detailed) Social History: (Detailed) Tobacco use reviewed. Preferred language is [place]. (DetailedMarland Kitchen  Athenia, Rys [id number] 1990/12/31 11/08/2020 10:30 AM Page: 1/6  Smoking status: Current every day smoker. SMOKING STATUS Type Smoking Status Usage Per Day Years Used Total Pack Years Smoking cessation education  Smokeless Current every day smoker TOBACCO CESSATION INFORMATION 0 11/08/2020 Donald Siva CMA Tobacco cessation counseling MEDICATIONS: (added, continued or stopped this visit) completed Date CounseledBy Order Status Description Code  Tobacco Cessation Information  Started ALLERGIES: Ingredient NO KNOWN ALLERGIES Medication Keppra 500 mg tablet Keppra 750 mg tablet Reaction Directions Instruction Stopped  No known allergies. Reviewed, no changes. PHYS ICAL EXAM: Vitals Date Temp F 11/08/2020 BP Pulse 127/86 71 Ht In 65 Left Wt Lb BMI 133 22.13 BSA Pain Score 0/10 take 1 tablet by oral route 2 times every day take 1 tablet by oral route 2 times every day Medication Name Comment     PHYSICAL EXAM Details General Level of Distress: Overall Appearance: Head and Face Fundoscopic Exam: Cardiovascular no acute distress N ormal Right unable to visualize unable to visualize Vicktoria, Muckey [id number] Oct 24, 1990 11/08/2020 10:30 AM Page: 2/6  Peripheral Pulses: Carotid Pulses: Respiratory Lungs: Neurological Right normal non-labored Left normal normal Orientation: Recent and Remote Memory: Attention Span and Concentration: Language: Fund of Knowledge: Sensation: Upper Extremity Coordination: Lower  Extremity Coordination: Musculoskeletal Gait and Station: Upper Extremity Muscle Tone: Lower Extremity Muscle Tone: Motor Strength normal normal normal normal normal Right normal normal normal normal Right normal normal Left normal normal normal Left . Any abnormal findings will be noted below. Right Deltoid: 5/5 Biceps: 5/5 Triceps: 5/5 Wrist Extensor: 5/5 Grip: 5/5 Finger Extensor: 5/5 Hip Fl exor: 5/5 Knee Extensor: 5/5 Knee Flexor: 5/5 Tib Anterior: 5/5 EHL: 5/5 Medial Gastroc: 5/5 Gaze Normal Horizontal Gaze Stability: Deep Tendon Reflexes Right normal Left 5/5 5/5 5/5 5/5 5/5 5/5 5/5 5/5 5/5 5/5 5/5 5/5 Left Biceps: 2+ Alonda, Weaber [id number] 1991-02-06 11/08/2020 10:30 AM Page: 3/6 2+  Brachioradialis: Patellar: Achilles: Sensory 2+ 2+ 2+ 2+ 2+ 2+ Sensation was tested a t C2 to T1 and L1 to S1. Cranial Nerves II. Optic Nerve/Visual Fields: III. Oculomotor: IV. Trochlear: V. Trigeminal: VI. Abducens: VII. Facial: VIII. Acoustic/Vestibular: IX. Glossopharyngeal: X. Vagus: XI. Spinal Accessory: XII. Hypoglossal: DIAGNOSTIC RESULTS: normal EOMI EOMI sensory intact EOMI no facial droop hearing intact palate elevation symmetric no hoarseness shoulder shrug f ull tongue protrusion midline  CTheadandMRIwerereviewed. Thereisanonenhancinglesionofthesuperiorfrontalgyrusalongitsmedialandinferior portion, adjacent to the cingulate  sulcus. It appears expansile. There is also a less apparent expansile lesioning involving the right anterior insula, involving the short gyri. Her most recent MRI is  stable compared with her MRI from October. IMPRESSION: Thisisa30 year oldwomanwithnewlydiagno sedepilepsywasfoundtohavemultiplebrainlesions. 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 PLAN: - craniotomy for resective biopsy of lesion -risks, benefits, alternatives, and expected convalescence discussed

## 2020-12-04 NOTE — Transfer of Care (Signed)
Immediate Anesthesia Transfer of Care Note  Patient: Lori Collier  Procedure(s) Performed: CRANIOTOMY LEFT FRONTAL RESECTION OF BRAIN LESION (Left Head) APPLICATION OF CRANIAL NAVIGATION (Left )  Patient Location: PACU  Anesthesia Type:General  Level of Consciousness: awake and alert   Airway & Oxygen Therapy: Patient Spontanous Breathing and Patient connected to face mask oxygen  Post-op Assessment: Report given to RN, Post -op Vital signs reviewed and stable and Patient moving all extremities X 4  Post vital signs: Reviewed and stable  Last Vitals:  Vitals Value Taken Time  BP 125/73 12/04/20 1306  Temp    Pulse 104 12/04/20 1308  Resp 20 12/04/20 1308  SpO2 97 % 12/04/20 1308  Vitals shown include unvalidated device data.  Last Pain:  Vitals:   12/04/20 0618  TempSrc:   PainSc: 0-No pain         Complications: No complications documented.

## 2020-12-04 NOTE — Op Note (Signed)
Procedure(s): CRANIOTOMY LEFT FRONTAL RESECTION OF BRAIN LESION APPLICATION OF CRANIAL NAVIGATION Procedure Note  Lori Collier female 30 y.o. 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  As such, after interdisciplinary discussion, I recommended resection of the superior frontal gyrus lesion for diagnosis and potential cytoreductive benefit, with possibility of some benefit for her epilepsy as well.  Risks, benefits, alternatives, and expected convalescence were discussed with her.  Risks discussed included, but were not limited to, bleeding, pain, infection, seizure, scar, stroke, neurologic deficit, recurrence, coma, and death.  Informed consent was obtained..     Surgeon: Vallarie Mare   Assistants: None  Anesthesia: General endotracheal anesthesia   Procedure Detail  1.  Left frontal craniotomy for resection of superior frontal gyrus lesion 2.  Use of cranial neuro navigation with BrainLab system 3.  Use of microscope for intraoperative microdissection  Patient was brought to the operating room.  General anesthesia was induced and patient was intubated by the anesthesia service.  After appropriate lines and monitors were placed, patient was positioned supine and the head was placed in a [place] head holder and affixed the bed.  A thin cut MRI was reconstructed into a 3D image.  This was used to perform surface match registration with the BrainLab navigation system.  Using the BrainLab, a customized slightly  curvilinear incision was planned over the frontal area.  A small amount of hair was clipped to allow for a hair sparing incision.  Head was preprepped with alcohol and prepped and draped in sterile fashion.  1% lidocaine with epinephrine was injected in the skin.  A timeout was performed.  Preoperative antibiotics, dexamethasone, and Keppra were given.  Incision was made with a 10 blade and the periosteum was swept off the skull.  Fishhooks were used to retract the skin.  The midline was confirmed with neuro navigation and high-speed drill was used to perform bur holes, 2 over the sagittal sinus and one more laterally.  The dura was dissected from the inner table skull and a craniotomy was completed with the craniotome.  Meticulous epidural hemostasis was obtained and a strip of Gelfoam was placed over the sagittal sinus.  The dura was opened in a C-shaped manner and flapped towards the sinus.  A more anterior bridging vein was seen which delineated the lateral and anterior aspect of the lesion.  The posterior boundary for the corticotomy was judged using neuro navigation.  Bipolar was used to coagulate the cortex and lateral, anterior and posterior corticotomy was made with microscissors.  Microscope was then introduced in the field to allow for intraoperative microdissection.  Dissection then proceeded in the interhemispheric fissure were arachnoid bands were cut.  En passage distal anterior cerebral artery branch was identified and protected.  This marked the medial inferior border of the initial corticotomy.  Bipolar and microscissors were used to perform the corticotomy along the medial surface and the intervening white matter was cut to connect the corticotomy cuts.  The ACA branch was dissected off  the specimen using microdissection technique.  This specimen was then sent for permanent section.  The rest of the resection was completed via a subpial resection in order to protect the anterior cerebral artery  branches.  The callosomarginal arteries were identified deeper in the interhemispheric fissure and were protected.  The cingulate sulcus demarcated the deep aspect of the resection.  Subpial resection was then performed with the assistance of neuro navigation with the full resection of the lesion, with additional specimens saved for permanent section.  The lesional tissue was quite firm.  Frozen section was sent and returned as a glial-based lesion, possibly glioma.  Meticulous hemostasis was obtained.  The dura was then closed with 4-0 Nurolon stitches.  As the anterior aspect of the dura was not competently holding stitches well, DuraGen plus onlay was placed.  Meticulous epidural hemostasis was obtained.  The bone flap was replaced with Stryker cranial plating system.  The wound was irrigated thoroughly and the skin was closed with 2-0 Vicryl stitches in buried interrupted fashion followed by 3-0 Vicryl Rapide in running fashion for the skin.  Bacitracin was placed over the wound and a head wrap was placed.  Patient was then extubated by the anesthesia service.  All counts were correct in the end of surgery.  No complications were noted.  Findings: Abnormal texture and firmness of the lesion, which was glial based on frozen section  Estimated Blood Loss:  200 mL         Drains: None         Total IV Fluids: 100 ml  Blood Given: none          Specimens: Left frontal lobe lesion         Implants: Stryker cranial plating system        Complications:  * No complications entered in OR log *         Disposition: PACU - hemodynamically stable.         Condition: stable

## 2020-12-05 ENCOUNTER — Inpatient Hospital Stay (HOSPITAL_COMMUNITY): Payer: PRIVATE HEALTH INSURANCE

## 2020-12-05 ENCOUNTER — Encounter (HOSPITAL_COMMUNITY): Payer: Self-pay | Admitting: Neurosurgery

## 2020-12-05 MED ORDER — OXYCODONE-ACETAMINOPHEN 5-325 MG PO TABS: 1.0000 | ORAL_TABLET | ORAL | 0 refills | Status: DC | PRN

## 2020-12-05 MED ORDER — DOCUSATE SODIUM 100 MG PO CAPS: 100.0000 mg | ORAL_CAPSULE | Freq: Two times a day (BID) | ORAL | 0 refills | Status: DC

## 2020-12-05 MED ORDER — POTASSIUM CHLORIDE CRYS ER 20 MEQ PO TBCR
40.0000 meq | EXTENDED_RELEASE_TABLET | Freq: Once | ORAL | Status: AC
  Administered 2020-12-05: 40 meq via ORAL
  Filled 2020-12-05: qty 2

## 2020-12-05 MED ORDER — DEXAMETHASONE 2 MG PO TABS: ORAL_TABLET | ORAL | 0 refills | Status: AC

## 2020-12-05 MED FILL — Thrombin For Soln 5000 Unit: CUTANEOUS | Qty: 5000 | Status: AC

## 2020-12-05 MED FILL — Thrombin For Soln 20000 Unit: CUTANEOUS | Qty: 1 | Status: AC

## 2020-12-05 NOTE — Discharge Instructions (Addendum)
Craniectomy Craniectomy is a procedure to remove a section of the skull in order to relieve pressure on the brain. This procedure is also called a decompressive craniectomy. It may be done to treat any condition that makes the brain swell or bleed, such as a brain injury or a stroke. The skull does not expand to allow swelling, so any swelling that occurs can cause damaging pressure on the brain. In a craniectomy, a large section of the skull is removed so that the brain can swell without being damaged. This surgery is an emergency procedure. If you need this surgery, it will be done right away. The section of skull that is removed may be stored and then put back in place in a later surgery. Tell your health care provider about:  Any allergies you have.  All medicines you are taking, including vitamins, herbs, eye drops, creams, and over-the-counter medicines.  Any problems you or family members have had with anesthetic medicines.  Any blood disorders you have.  Any surgeries you have had.  Any medical conditions you have.  Whether you are pregnant or may be pregnant. What are the risks? This is an emergency and life-saving procedure that includes some risks, such as:  Infection.  Allergic reactions to medicines.  Damage to nearby structures or organs.  Bleeding.  Brain damage.  Loss of cerebrospinal fluid, which is the fluid that surrounds and protects your brain and spinal cord.  Stroke.  Seizures. What happens before the surgery?  You will have imaging tests, such as a CT scan or an MRI, and blood tests.  Your health care providers will perform a physical exam and take a detailed medical history.  Follow instructions from your health care provider about eating or drinking restrictions.  Ask your health care provider: ? How your surgery site will be marked. ? What steps will be taken to help prevent infection. These may include:  Removing hair at the surgery  site.  Washing skin with a germ-killing soap.  Receiving antibiotic medicine. What happens during the surgery?  An IV will be inserted into one of your veins.  You will be given one or more of the following: ? A medicine to help you relax (sedative). ? A medicine to numb the area (local anesthetic). ? A medicine to make you fall asleep (general anesthetic).  A long, curved incision will be made in your head over the area where your brain is swollen. The incision will go down to your skull.  A flap of skin and scalp will be lifted away from your skull.  Several holes will be drilled through your skull. A bone saw will be used to connect these holes so that a section of your skull can be removed. The section that is removed will be large enough to allow your brain to swell without being restricted.  The lining covering your brain may also be opened.  Blood clots may be removed.  The section of your skull that was removed may be kept in case it can be put back in place later.  The flap of skin and scalp will be put back into position and stitched (sutured) closed to cover your brain. The procedure may vary among health care providers and hospitals.   What happens after the surgery?  Your blood pressure, heart rate, breathing rate, and blood oxygen level will be monitored until you leave the hospital or clinic.  You will stay in the hospital until you have recovered enough  to be sent home or to a rehabilitation facility.  You may get fluids, antibiotics, and pain medicine through your IV.  You may need to wear a special helmet to protect your brain. Summary  Craniectomy is a procedure to remove a section of the skull in order to relieve pressure on the brain.  This procedure may be done to treat any condition that makes the brain swell or bleed.  The skull does not expand to allow swelling, so any swelling that occurs can cause damaging pressure on the brain.  During a  craniectomy, a large section of the skull is removed so that the brain can swell without being damaged.  This surgery is an emergency and life-saving procedure. This information is not intended to replace advice given to you by your health care provider. Make sure you discuss any questions you have with your health care provider. Document Revised: 04/19/2019 Document Reviewed: 03/15/2019 Elsevier Patient Education  2021 [place], Adult Glioma is a type of abnormal tissue mass (tumor) that can develop in the brain and spinal cord (central nervous system). Glioma tumors are made up of cells called glial cells. Glial cells normally provide nutrition, oxygen, and structural support to the brain. Gliomas are named after the type of glial cell that is involved in the tumor. Gliomas are one of the most common types of brain tumors that occur in adults. The most common types of gliomas include:  Astrocytoma.  Glioblastoma. This may also be called a glioblastoma multiforme, or GBM.  Ependymoma.  Mixed glioma. This may also be called oligoastrocytoma.  Oligodendroglioma.  Optic glioma. What are the causes? A tumor is formed when glial cells grow and divide more than normal and form into a mass of tissue. What makes the cells grow and divide excessively is not known. What increases the risk? The following factors may make you more likely to develop this condition:  Being 34-46 years old.  Having been exposed to radiation, such as previous radiation therapy for cancer.  Being exposed to certain chemicals, including vinyl chloride.  Having certain genetic disorders such as neurofibromatosis type 1 or a family history of cancer syndrome, such as Li-Fraumeni syndrome or Turcot syndrome. What are the signs or symptoms? Symptoms of gliomas depend on the size, type, and location of the tumor. Symptoms of this condition include:  Headache, which may be worse in the morning and improve  after vomiting.  Nausea and vomiting.  Vision, hearing, or speech changes and problems.  Seizures.  Inability to walk, loss of balance, or weakness and numbness on one side of the body or in an arm or leg.  Changes in mood, personality, memory, or thinking.  Changes in weight or energy. How is this diagnosed? This condition is diagnosed based on a medical history and a physical exam, including a neurological exam. Brain imaging tests will also be done, such as a CT scan or MRI. A sample of the tumor will be taken and studied in a lab (biopsy) to confirm the diagnosis. This will also determine the grade of the tumor, which may be low or high. High-grade tumors grow faster. If glioma is confirmed, it will be staged to determine its severity and extent. Staging for gliomas is based on the characteristics of the biopsy and genetic testing of the tumor cells. There is no standard staging system for brain and spinal cord tumors in adults.   How is this treated? There are many ways to treat this  condition. Treatment depends on the size, location, grade, and stage of the tumor. Often, more than one type of treatment is used. Slow growing or low-grade gliomas may be monitored at first, with treatment sometimes delayed until symptoms affect daily activities. Treatment for fast growing gliomas may include:  Surgery to remove as much of the tumor as possible.  High-energy rays (radiation therapy) to help shrink or kill the tumor. There are different types of radiation therapy, including intensity-modulated radiation therapy, proton beam therapy, and stereotactic radiation therapy (radiosurgery).  Chemotherapy. This is the use of medicines to shrink or kill the tumor. The medicines can be given by injection or as a pill.  Targeted therapy. This uses substances that injure or kill cancer cells without affecting normal cells.  Immunotherapy. This is the use of medicines to boost the body's defense system  (immune system) to kill the tumor cells.  New treatments through a clinical trial. Steroid medicines may be given to decrease brain swelling and improve symptoms. Other medicines may also be given to treat or prevent seizures. Your health care provider may also refer you to physical, occupational, or speech therapy, or a combination of these therapies, as part of your treatment. Gliomas can come back after treatment. If you have a glioma that comes back, you may need additional treatment. Follow these instructions at home:  Take over-the-counter and prescription medicines only as told by your health care provider.  Consider joining a support group.  Work with your health care provider to manage any side effects that you experience during treatment.  Do exercises and activities as told by your health care provider or therapist.  Keep all follow-up visits. This is important. Your health care provider will closely monitor you for any signs that the cancer is returning. You may need more frequent visits with your health care provider as well as periodic imaging tests, such as MRIs, to monitor the tumor or a previous tumor location. Where to find more information  American Brain Tumor Association: www.abta.org  American Cancer Society: www.cancer.[place]: www.cancer.gov Contact a health care provider if:  You have worsening symptoms or symptoms that have returned.  You have diarrhea or abdominal pain.  You cannot eat or drink what you need. Get help right away if:  You have a seizure, particularly if this is new.  You have a fever.  You experience new symptoms, such as vision problems or difficulty walking.  You have trouble breathing.  You have bleeding that does not stop. These symptoms may represent a serious problem that is an emergency. Do not wait to see if the symptoms will go away. Get medical help right away. Call your local emergency services (911  in the [place] Do not drive yourself to the hospital. Summary  A glioma is a type of abnormal tissue mass (tumor) that can develop in the brain and spinal cord (central nervous system).  Often, more than one type of treatment is used, which may include surgery, chemotherapy, and radiation therapy.  Because this type of cancer can return, it is important to tell your health care provider if you have new symptoms.  You will be monitored closely for any signs of the cancer returning. This information is not intended to replace advice given to you by your health care provider. Make sure you discuss any questions you have with your health care provider. Document Revised: 06/29/2020 Document Reviewed: 06/29/2020 Elsevier Patient Education  2021 [place]. Can remove headwrap  on 2/25 and shower 2/26.  Do not directly scrub wound.  Ok to use baby shampoo Walk as much as possible No heavy lifting >10 lbs No strenuous activity

## 2020-12-05 NOTE — Plan of Care (Signed)
  Problem: Education: Goal: Knowledge of the prescribed therapeutic regimen will improve Outcome: Progressing   Problem: Clinical Measurements: Goal: Usual level of consciousness will be regained or maintained. Outcome: Completed/Met Goal: Ability to maintain intracranial pressure will improve Outcome: Progressing   Problem: Skin Integrity: Goal: Demonstration of wound healing without infection will improve Outcome: Progressing

## 2020-12-05 NOTE — Discharge Summary (Signed)
Physician Discharge Summary  Patient ID: Lori Collier MRN: [id number] DOB/AGE: May 21, 1991 30 y.o.  Admit date: 12/04/2020 Discharge date: 12/05/2020  Admission Diagnoses:  Left frontal brain lesion  Discharge Diagnoses:  Same Active Problems:   S/P craniotomy   Lesion of left frontal lobe of brain   Discharged Condition: Stable  Hospital Course:  Lori Collier is a 30 y.o. 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  Postoperatively, patient was ambulating, tolerating a regular diet, voiding without difficulty, and pain was well-controlled with PO pain medications.  Postop MRI showed excellent resection without evidence of any untoward complication.  She was deemed ready for DC on 2/24  Treatments: Surgery - Left craniotomy for resection of brain lesion  Discharge Exam: Blood pressure 116/78, pulse 72, temperature 97.9 F (36.6 C), temperature source Oral, resp. rate 19, height 5\' 5"  (1.651 m), weight 60.2 kg, last menstrual period 11/12/2020, SpO2 100 %. Awake, alert, oriented Speech fluent, appropriate CN grossly intact 5/5 BUE/BLE Head wrap in place   Disposition: Discharge disposition: 01-Home or Self Care        Allergies as of 12/05/2020      Reactions   Natural Vegetable Orange [psyllium]    Tingling sensation in the tongue, and itchy throat per patient      Medication List    TAKE these medications   albuterol 108 (90 Base) MCG/ACT inhaler Commonly known as: VENTOLIN HFA Inhale 1 puff into the lungs every 4 (four) hours as needed for shortness of breath.   dexamethasone 2 MG tablet Commonly known as: DECADRON Take 2 tablets (4 mg total) by mouth 3 (three) times daily for 1 day, THEN 1 tablet (2 mg total) 3 (three) times daily for 2 days, THEN 1 tablet (2 mg total) 2 (two) times daily for 2 days, THEN 0.5 tablets (1 mg total) 2 (two) times daily for 2  days, THEN 0.5 tablets (1 mg total) daily for 2 days. Start taking on: December 05, 2020   docusate sodium 100 MG capsule Commonly known as: COLACE Take 1 capsule (100 mg total) by mouth 2 (two) times daily.   fluticasone 50 MCG/ACT nasal spray Commonly known as: FLONASE Place 1 spray into both nostrils daily as needed for allergies or rhinitis.   ibuprofen 200 MG tablet Commonly known as: ADVIL Take 400 mg by mouth every 6 (six) hours as needed for moderate pain.   levETIRAcetam 500 MG tablet Commonly known as: Keppra Take 1.5 tablet twice a day What changed:   how much to take  how to take this  when to take this  additional instructions   loratadine 10 MG tablet Commonly known as: CLARITIN Take 10 mg by mouth daily as needed for allergies.   naproxen sodium 220 MG tablet Commonly known as: ALEVE Take 660 mg by mouth as needed (pain).   norethindrone 0.35 MG tablet Commonly known as: MICRONOR Take 1 tablet (0.35 mg total) by mouth daily.   oxyCODONE-acetaminophen 5-325 MG tablet Commonly known as: Percocet Take 1 tablet by mouth every 4 (four) hours as needed for severe pain.       Follow-up Information    Lori Mare, MD Follow up in 2 week(s).   Specialty: Neurosurgery Contact information: [address] [place] [address] [phone]               Signed:  Vallarie Collier 12/05/2020, 12:18 PM

## 2020-12-05 NOTE — Progress Notes (Signed)
Pt d/c to home by car with family. Assessment stable. D/C instructions given and all questions answered.

## 2020-12-05 NOTE — Anesthesia Postprocedure Evaluation (Signed)
Anesthesia Post Note  Patient: Lori Collier  Procedure(s) Performed: CRANIOTOMY LEFT FRONTAL RESECTION OF BRAIN LESION (Left Head) APPLICATION OF CRANIAL NAVIGATION (Left )     Patient location during evaluation: PACU Anesthesia Type: General Level of consciousness: awake and alert Pain management: pain level controlled Vital Signs Assessment: post-procedure vital signs reviewed and stable Respiratory status: spontaneous breathing, nonlabored ventilation, respiratory function stable and patient connected to nasal cannula oxygen Cardiovascular status: blood pressure returned to baseline and stable Postop Assessment: no apparent nausea or vomiting Anesthetic complications: no   No complications documented.               Effie Berkshire

## 2020-12-17 ENCOUNTER — Other Ambulatory Visit: Payer: Self-pay | Admitting: Radiation Therapy

## 2020-12-19 MED ORDER — NORETHINDRONE 0.35 MG PO TABS: 1.0000 | ORAL_TABLET | Freq: Every day | ORAL | 4 refills | Status: DC

## 2020-12-23 ENCOUNTER — Encounter (HOSPITAL_COMMUNITY): Payer: Self-pay | Admitting: Neurosurgery

## 2020-12-23 ENCOUNTER — Telehealth: Payer: Self-pay | Admitting: Internal Medicine

## 2020-12-23 LAB — SURGICAL PATHOLOGY

## 2020-12-23 NOTE — Telephone Encounter (Signed)
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 Lori Collier has been cld and scheduled to see Lori Collier on 3/15 at [place]. She was unaware of the referral, but was willing to come in for an appt. I explained to her that she would need to call Lori Collier' office to inform her of the reason.

## 2020-12-24 ENCOUNTER — Inpatient Hospital Stay: Payer: PRIVATE HEALTH INSURANCE | Attending: Neurosurgery | Admitting: Internal Medicine

## 2020-12-24 ENCOUNTER — Other Ambulatory Visit: Payer: Self-pay

## 2020-12-24 VITALS — BP 101/80 | HR 84 | Temp 97.7°F | Resp 13 | Ht 65.0 in | Wt 134.2 lb

## 2020-12-24 DIAGNOSIS — C711 Malignant neoplasm of frontal lobe: Secondary | ICD-10-CM | POA: Diagnosis not present

## 2020-12-24 DIAGNOSIS — C719 Malignant neoplasm of brain, unspecified: Secondary | ICD-10-CM

## 2020-12-24 NOTE — Progress Notes (Signed)
[facility] at [place], [place] [address] [phone]   New Patient Evaluation  Date of Service: 12/24/20 Patient Name: Lori Collier Patient MRN: [id number] Patient DOB: [birth year] Provider: Ventura Sellers, MD  Identifying Statement:  Lori Collier is a 30 y.o. 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    Referring Provider: Vallarie Mare, MD [address] [place],   [address]  Oncologic History: Oncology History  Oligodendroglioma, IDH gene mutant and 1p/19q-codeleted [facility])  12/04/2020 Surgery   Craniotomy, resection of left frontal mass with Dr. Marcello Moores.  Path is oligodendroglioma IDH-1 mut and 1p/19q co-deleted     Biomarkers:  MGMT Unknown.  IDH 1/2 Mutated.  EGFR Unknown  1p/19q co-deleted   History of Present Illness: The patient's records from the referring physician were obtained and reviewed and the patient interviewed to confirm this HPI.  Lori Collier presented to medical attention this past October, when she began to experience new onset seizures.  Seizures are described as shaking with impaired awareness, lasting for minutes.  CNS imaging demonstrated 2 foci of FLAIR signal abnormality (R insular, L frontal).  She then underwent resection of the left frontal lesion with Dr. Marcello Moores on 12/04/20, given high suspicion for primary neoplasm.  Following surgery, she has been seizure-free, continues on Keppra $RemoveB'750mg'oGKvVhHV$  BID.  She is fully fucntional and independent, though describes some fatigue and some slowdown in her short term memory.  She presents to clinic today to review pathology and discuss treatment plan options.   Medications: Current Outpatient Medications on File Prior to Visit  Medication Sig Dispense Refill  . albuterol (VENTOLIN HFA) 108 (90 Base) MCG/ACT inhaler Inhale 1 puff into the lungs every 4 (four) hours as needed for  shortness of breath.    Marland Kitchen ibuprofen (ADVIL) 200 MG tablet Take 400 mg by mouth every 6 (six) hours as needed for moderate pain.    Marland Kitchen levETIRAcetam (KEPPRA) 500 MG tablet Take 1.5 tablet twice a day (Patient taking differently: Take 750 mg by mouth 2 (two) times daily.) 270 tablet 3  . norethindrone (MICRONOR) 0.35 MG tablet Take 1 tablet (0.35 mg total) by mouth daily. 84 tablet 4  . docusate sodium (COLACE) 100 MG capsule Take 1 capsule (100 mg total) by mouth 2 (two) times daily. (Patient not taking: Reported on 12/24/2020) 60 capsule 0  . fluticasone (FLONASE) 50 MCG/ACT nasal spray Place 1 spray into both nostrils daily as needed for allergies or rhinitis. (Patient not taking: No sig reported)    . loratadine (CLARITIN) 10 MG tablet Take 10 mg by mouth daily as needed for allergies.  (Patient not taking: No sig reported)    . naproxen sodium (ALEVE) 220 MG tablet Take 660 mg by mouth as needed (pain). (Patient not taking: No sig reported)    . oxyCODONE-acetaminophen (PERCOCET) 5-325 MG tablet Take 1 tablet by mouth every 4 (four) hours as needed for severe pain. (Patient not taking: Reported on 12/24/2020) 30 tablet 0   No current facility-administered medications on file prior to visit.    Allergies:  Allergies  Allergen Reactions  . Natural Vegetable Orange [Psyllium]     Tingling sensation in the tongue, and itchy throat per patient   Past Medical History:  Past Medical History:  Diagnosis Date  . Anxiety   . Asthma    ALLERGY INDUCED  . Depression   .  Endometriosis   . Headache    MIGRAINES  . PCOS (polycystic ovarian syndrome)    Past Surgical History:  Past Surgical History:  Procedure Laterality Date  . APPENDECTOMY  09/2010  . APPLICATION OF CRANIAL NAVIGATION Left 12/04/2020   Procedure: APPLICATION OF CRANIAL NAVIGATION;  Surgeon: Bedelia Person, MD;  Location: [facility] OR;  Service: Neurosurgery;  Laterality: Left;  . CRANIOTOMY Left 12/04/2020   Procedure: CRANIOTOMY  LEFT FRONTAL RESECTION OF BRAIN LESION;  Surgeon: Bedelia Person, MD;  Location: [facility] OR;  Service: Neurosurgery;  Laterality: Left;  . ROBOTIC ASSISTED LAPAROSCOPIC OVARIAN CYSTECTOMY Bilateral 12/12/2018   Procedure: XI ROBOTIC ASSISTED LAPAROSCOPIC OVARIAN CYSTECTOMY, CONSERVATIVE TREATMENT OF ENDOMETRIOSIS;  Surgeon: Genia Del, MD;  Location: [facility];  Service: Gynecology;  Laterality: Bilateral;   Social History:  Social History   Socioeconomic History  . Marital status: Single    Spouse name: Not on file  . Number of children: Not on file  . Years of education: Not on file  . Highest education level: Not on file  Occupational History  . Not on file  Tobacco Use  . Smoking status: Current Some Day Smoker  . Smokeless tobacco: Never Used  Vaping Use  . Vaping Use: Every day  . Substances: Nicotine  Substance and Sexual Activity  . Alcohol use: Yes    Comment: occ  . Drug use: Yes    Types: Marijuana  . Sexual activity: Not Currently    Comment: 1st intercourse- 16, partners- 22,   Other Topics Concern  . Not on file  Social History Narrative   Right handed    Lives with boy friend    One story home    Social Determinants of Health   Financial Resource Strain: Not on file  Food Insecurity: Not on file  Transportation Needs: Not on file  Physical Activity: Not on file  Stress: Not on file  Social Connections: Not on file  Intimate Partner Violence: Not on file   Family History:  Family History  Problem Relation Age of Onset  . Hypertension Mother   . Thyroid disease Mother     Review of Systems: Constitutional: Doesn't report fevers, chills or abnormal weight loss Eyes: Doesn't report blurriness of vision Ears, nose, mouth, throat, and face: Doesn't report sore throat Respiratory: Doesn't report cough, dyspnea or wheezes Cardiovascular: Doesn't report palpitation, chest discomfort  Gastrointestinal:  Doesn't report nausea,  constipation, diarrhea GU: Doesn't report incontinence Skin: Doesn't report skin rashes Neurological: Per HPI Musculoskeletal: Doesn't report joint pain Behavioral/Psych: Doesn't report anxiety  Physical Exam: Vitals:   12/24/20 0855  BP: 101/80  Pulse: 84  Resp: 13  Temp: 97.7 F (36.5 C)  SpO2: 100%   KPS: 90. General: Alert, cooperative, pleasant, in no acute distress Head: Normal EENT: No conjunctival injection or scleral icterus.  Lungs: Resp effort normal Cardiac: Regular rate Abdomen: Non-distended abdomen Skin: No rashes cyanosis or petechiae. Extremities: No clubbing or edema  Neurologic Exam: Mental Status: Awake, alert, attentive to examiner. Oriented to self and environment. Language is fluent with intact comprehension.  Cranial Nerves: Visual acuity is grossly normal. Visual fields are full. Extra-ocular movements intact. No ptosis. Face is symmetric Motor: Tone and bulk are normal. Power is full in both arms and legs. Reflexes are symmetric, no pathologic reflexes present.  Sensory: Intact to light touch Gait: Normal.   Labs: I have reviewed the data as listed    Component Value Date/Time   NA  140 12/04/2020 0953   K 3.2 (L) 12/04/2020 0953   CL 107 12/04/2020 0953   CO2 24 08/23/2019 1420   GLUCOSE 151 (H) 12/04/2020 0953   BUN 7 12/04/2020 0953   CREATININE 0.50 12/04/2020 0953   CALCIUM 9.3 08/23/2019 1420   PROT 6.4 (L) 09/14/2017 0523   ALBUMIN 3.9 09/14/2017 0523   AST 20 09/14/2017 0523   ALT 16 09/14/2017 0523   ALKPHOS 54 09/14/2017 0523   BILITOT 0.8 09/14/2017 0523   GFRNONAA >60 08/23/2019 1420   GFRAA >60 08/23/2019 1420   Lab Results  Component Value Date   WBC 9.0 12/02/2020   NEUTROABS 3.5 10/31/2010   HGB 11.9 (L) 12/04/2020   HCT 35.0 (L) 12/04/2020   MCV 97.2 12/02/2020   PLT 316 12/02/2020    Imaging:  MR BRAIN WO CONTRAST  Result Date: 12/05/2020 CLINICAL DATA:  Postop day 1 status post left frontal craniotomy  for lesion resection. EXAM: MRI HEAD WITHOUT CONTRAST TECHNIQUE: Multiplanar, multiecho pulse sequences of the brain and surrounding structures were obtained without intravenous contrast. COMPARISON:  11/30/2020 FINDINGS: Brain: Sequelae of interval left frontal craniotomy are identified. There is a resection cavity in the medial left frontal lobe containing blood products. There is minimal T2 FLAIR hyperintensity in the surrounding white matter which may reflect minimal edema although minimal residual tumor is also possible (if the resected lesion does in fact turn out to be tumor on final pathology). There is no acute infarct. T2 hyperintensity involving cortex and subcortical white matter along the anterior aspect of the right insula is unchanged. The brain remains normal in signal elsewhere. The ventricles are normal in size. There is small volume pneumocephalus and trace extra-axial fluid over the left cerebral convexity. There is no midline shift. Vascular: Major intracranial vascular flow voids are preserved. Skull and upper cervical spine: Left frontal craniotomy. Sinuses/Orbits: Unremarkable orbits. Left maxillary sinus mucous retention cyst. Clear mastoid air cells. Other: None. IMPRESSION: 1. Interval left frontal lobe lesion resection without evidence of an acute complication. 2. Unchanged right insular abnormality with differential considerations as previously outlined. Electronically Signed   By: Logan Bores M.D.   On: 12/05/2020 11:53   MR BRAIN W WO CONTRAST  Result Date: 12/01/2020 CLINICAL DATA:  Preoperative evaluation prior to surgery. Idiopathic epilepsy presentation. Abnormal brain MRI previously. EXAM: MRI HEAD WITHOUT AND WITH CONTRAST TECHNIQUE: Multiplanar, multiecho pulse sequences of the brain and surrounding structures were obtained without and with intravenous contrast. CONTRAST:  55mL GADAVIST GADOBUTROL 1 MMOL/ML IV SOLN COMPARISON:  07/17/2020.  10/15/2020. FINDINGS: Brain: No  change since the previous examinations. The brainstem and cerebellum are normal. Abnormal cortical and subcortical brain in the deep insula on the right. Increased T2 and FLAIR signal. Increased thickness the involved gyri. No sign of hemorrhage. No change. Second area of involvement is in the left frontal cortical and subcortical brain. Similarly, the appearance is unchanged/nonprogressive. Thickening of the involved gyri. No hemorrhage. No new area of brain involvement is seen. No hydrocephalus. No extra-axial collection. Vascular: Major vessels at the base of the brain show flow. Skull and upper cervical spine: Negative Sinuses/Orbits: Clear except for mild mucosal thickening in the maxillary sinuses and a left maxillary sinus retention cyst. Orbits negative. Other: None IMPRESSION: Stable examination since the 2 prior studies. Abnormal cortical and subcortical brain in the deep insula on the right and frontal lobe on the left. Thickened gyri with increased T2 and FLAIR signal. No hemorrhage. No enhancement seen previously (  no contrast given today). Differential diagnosis remains multifocal low-grade glioma versus cortical dysplasia/hamartoma. Electronically Signed   By: Nelson Chimes M.D.   On: 12/01/2020 01:21    Pathology: SURGICAL PATHOLOGY  CASE: [id number]  PATIENT: Moody Bruins  Surgical Pathology Report   Clinical History: lesion of brain (cm)    FINAL MICROSCOPIC DIAGNOSIS:   A. BRAIN, LEFT FRONTAL, BIOPSY:  - Oligodendroglioma, IDH-mutant and 1p/19q-codeleted, CNS WHO grade 2.  See comment   B. BRAIN, LEFT FRONTAL, BIOPSY:  - Oligodendroglioma, IDH-mutant and 1p/19q-codeleted, CNS WHO grade 2.  See comment   COMMENT:   A and B.  This case was sent in consultation to Dr. Maisie Fus at [facility] neuropathology. Studies performed at the outside institution  showed that IDH1 R132H is positive, ATRX is retained and p53 shows a  wild-type pattern. 1p/19q FISH shows  codeletion. A copy of the outside  report is available in patient's electronic medical records. Dr. Marcello Moores  was paged on 12/23/2020.    Assessment/Plan Oligodendroglioma, IDH gene mutant and 1p/19q-codeleted ([place]) [C71.9]  We appreciate the opportunity to participate in the care of Lori Collier.   She presents with a clinical and radiographic syndrome consistent with focal seizures, secondary to multifocal cortically based glioma.  Path report from [facility] confirms grade II histology with favorable genetics.  The clinical history, imaging, histology, tumor genetics, features of oligodendroglioma (clinical course, prognosis) were discussed at length today with the patient and her sister at bedside.   We provided two reasonable options for approaching this tumor in the near term: 1) Completing resection of right insular lesion.  This would place her into the "low risk" bucket (being healthy, younger than 40, with GTR) and we could defer radiochemotherapy with good empirical support. 2) Deferring surgery, and continuing with tight serial imaging monitoring  I think at this time radiochemotherapy should be deferred if possible, patient is in agreement with this.     She will visit with Dr. Marcello Moores again later this week to continue discussion of surgical risks and benefits.  We will continue to communicate with him and discuss in multi-disciplinary fashion at brain/spine tumor board.  Screening for potential clinical trials was performed and discussed using eligibility criteria for active protocols at [facility], loco-regional tertiary centers, as well as national database available on [email or web address].com.    The patient is not a candidate for a research protocol at this time due to no suitable study identified.   We spent twenty additional minutes teaching regarding the natural history, biology, and historical experience in the treatment of brain tumors. We then discussed in detail the current  recommendations for therapy focusing on the mode of administration, mechanism of action, anticipated toxicities, and quality of life issues associated with this plan. We also provided teaching sheets for the patient to take home as an additional resource.  All questions were answered. The patient knows to call the clinic with any problems, questions or concerns. No barriers to learning were detected.  The total time spent in the encounter was 60 minutes and more than 50% was on counseling and review of test results   Ventura Sellers, MD Medical Director of Neuro-Oncology [facility] [place] 12/24/20 3:38 PM

## 2021-01-02 ENCOUNTER — Encounter: Payer: Self-pay | Admitting: Internal Medicine

## 2021-01-03 ENCOUNTER — Telehealth: Payer: Self-pay | Admitting: Internal Medicine

## 2021-01-03 NOTE — Telephone Encounter (Signed)
Scheduled appt per 3/24 sch msg. Pt aware.

## 2021-01-06 ENCOUNTER — Other Ambulatory Visit: Payer: Self-pay

## 2021-01-06 ENCOUNTER — Inpatient Hospital Stay: Payer: PRIVATE HEALTH INSURANCE | Admitting: Internal Medicine

## 2021-01-06 VITALS — BP 105/77 | HR 77 | Temp 97.0°F | Resp 17 | Ht 65.0 in | Wt 135.2 lb

## 2021-01-06 DIAGNOSIS — C711 Malignant neoplasm of frontal lobe: Secondary | ICD-10-CM | POA: Diagnosis not present

## 2021-01-06 DIAGNOSIS — C719 Malignant neoplasm of brain, unspecified: Secondary | ICD-10-CM | POA: Diagnosis not present

## 2021-01-06 NOTE — Progress Notes (Signed)
[facility] at [place], [place] [address] [phone]   Interval Evaluation  Date of Service: 01/06/21 Patient Name: Lori Collier Patient MRN: [id number] Patient DOB: [birth year] Provider: Ventura Sellers, MD  Identifying Statement:  Lori Collier is a 30 y.o. 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 Marcello Moores.  Path is oligodendroglioma IDH-1 mut and 1p/19q co-deleted     Biomarkers:  MGMT Unknown.  IDH 1/2 Mutated.  EGFR Unknown  1p/19q co-deleted   Interval History:  Lori Collier presents for evaluation today for further discussion after recent visit with neurosurgery.  She describes no new or progressive neurologic deficits.  No seizures or headaches.  Has significant anxiety regarding upcoming treatment options, possible surgery.  H+P (12/24/20) Patient presented to medical attention this past October, when she began to experience new onset seizures.  Seizures are described as shaking with impaired awareness, lasting for minutes.  CNS imaging demonstrated 2 foci of FLAIR signal abnormality (R insular, L frontal).  She then underwent resection of the left frontal lesion with Dr. Marcello Moores on 12/04/20, given high suspicion for primary neoplasm.  Following surgery, she has been seizure-free, continues on Keppra 760m BID.  She is fully fucntional and independent, though describes some fatigue and some slowdown in her short term memory.  She presents to clinic today to review pathology and discuss treatment plan options.   Medications: Current Outpatient Medications on File Prior to Visit  Medication Sig Dispense Refill  . albuterol (VENTOLIN HFA) 108 (90 Base) MCG/ACT inhaler Inhale 1 puff into the lungs every 4 (four) hours as needed for shortness of  breath.    . docusate sodium (COLACE) 100 MG capsule Take 1 capsule (100 mg total) by mouth 2 (two) times daily. (Patient not taking: Reported on 12/24/2020) 60 capsule 0  . fluticasone (FLONASE) 50 MCG/ACT nasal spray Place 1 spray into both nostrils daily as needed for allergies or rhinitis. (Patient not taking: No sig reported)    . ibuprofen (ADVIL) 200 MG tablet Take 400 mg by mouth every 6 (six) hours as needed for moderate pain.    .Marland KitchenlevETIRAcetam (KEPPRA) 500 MG tablet Take 1.5 tablet twice a day (Patient taking differently: Take 750 mg by mouth 2 (two) times daily.) 270 tablet 3  . loratadine (CLARITIN) 10 MG tablet Take 10 mg by mouth daily as needed for allergies.  (Patient not taking: No sig reported)    . naproxen sodium (ALEVE) 220 MG tablet Take 660 mg by mouth as needed (pain). (Patient not taking: No sig reported)    . norethindrone (MICRONOR) 0.35 MG tablet Take 1 tablet (0.35 mg total) by mouth daily. 84 tablet 4  . oxyCODONE-acetaminophen (PERCOCET) 5-325 MG tablet Take 1 tablet by mouth every 4 (four) hours as needed for severe pain. (Patient not taking: Reported on 12/24/2020) 30 tablet 0   No current facility-administered medications on file prior to visit.    Allergies:  Allergies  Allergen Reactions  . Natural Vegetable Orange [Psyllium]     Tingling sensation in the tongue, and itchy throat per patient   Past Medical History:  Past Medical History:  Diagnosis Date  . Anxiety   . Asthma    ALLERGY INDUCED  . Depression   . Endometriosis   . Headache  MIGRAINES  . PCOS (polycystic ovarian syndrome)    Past Surgical History:  Past Surgical History:  Procedure Laterality Date  . APPENDECTOMY  09/2010  . APPLICATION OF CRANIAL NAVIGATION Left 12/04/2020   Procedure: APPLICATION OF CRANIAL NAVIGATION;  Surgeon: Vallarie Mare, MD;  Location: [place] [place];  Service: Neurosurgery;  Laterality: Left;  . CRANIOTOMY Left 12/04/2020   Procedure: CRANIOTOMY LEFT FRONTAL  RESECTION OF BRAIN LESION;  Surgeon: Vallarie Mare, MD;  Location: [place];  Service: Neurosurgery;  Laterality: Left;  . ROBOTIC ASSISTED LAPAROSCOPIC OVARIAN CYSTECTOMY Bilateral 12/12/2018   Procedure: XI ROBOTIC ASSISTED LAPAROSCOPIC OVARIAN CYSTECTOMY, CONSERVATIVE TREATMENT OF ENDOMETRIOSIS;  Surgeon: [name], MD;  Location: [place];  Service: Gynecology;  Laterality: Bilateral;   Social History:  Social History   Socioeconomic History  . Marital status: Single    Spouse name: Not on file  . Number of children: Not on file  . Years of education: Not on file  . Highest education level: Not on file  Occupational History  . Not on file  Tobacco Use  . Smoking status: Current Some Day Smoker  . Smokeless tobacco: Never Used  Vaping Use  . Vaping Use: Every day  . Substances: Nicotine  Substance and Sexual Activity  . Alcohol use: Yes    Comment: occ  . Drug use: Yes    Types: Marijuana  . Sexual activity: Not Currently    Comment: 1st intercourse- 16, partners- 22,   Other Topics Concern  . Not on file  Social History Narrative   Right handed    Lives with boy friend    One story home    Social Determinants of Health   Financial Resource Strain: Not on file  Food Insecurity: Not on file  Transportation Needs: Not on file  Physical Activity: Not on file  Stress: Not on file  Social Connections: Not on file  Intimate Partner Violence: Not on file   Family History:  Family History  Problem Relation Age of Onset  . Hypertension Mother   . Thyroid disease Mother     Review of Systems: Constitutional: Doesn't report fevers, chills or abnormal weight loss Eyes: Doesn't report blurriness of vision Ears, nose, mouth, throat, and face: Doesn't report sore throat Respiratory: Doesn't report cough, dyspnea or wheezes Cardiovascular: Doesn't report palpitation, chest discomfort  Gastrointestinal:  Doesn't report nausea, constipation,  diarrhea GU: Doesn't report incontinence Skin: Doesn't report skin rashes Neurological: Per HPI Musculoskeletal: Doesn't report joint pain Behavioral/Psych: Doesn't report anxiety  Physical Exam: Vitals:   01/06/21 0952  BP: 105/77  Pulse: 77  Resp: 17  Temp: (!) 97 F (36.1 C)  SpO2: 100%   KPS: 90. General: Alert, cooperative, pleasant, in no acute distress Head: Normal EENT: No conjunctival injection or scleral icterus.  Lungs: Resp effort normal Cardiac: Regular rate Abdomen: Non-distended abdomen Skin: No rashes cyanosis or petechiae. Extremities: No clubbing or edema  Neurologic Exam: Mental Status: Awake, alert, attentive to examiner. Oriented to self and environment. Language is fluent with intact comprehension.  Cranial Nerves: Visual acuity is grossly normal. Visual fields are full. Extra-ocular movements intact. No ptosis. Face is symmetric Motor: Tone and bulk are normal. Power is full in both arms and legs. Reflexes are symmetric, no pathologic reflexes present.  Sensory: Intact to light touch Gait: Normal.   Labs: I have reviewed the data as listed    Component Value Date/Time   NA 140 12/04/2020 0953   K 3.2 (  L) 12/04/2020 0953   CL 107 12/04/2020 0953   CO2 24 08/23/2019 1420   GLUCOSE 151 (H) 12/04/2020 0953   BUN 7 12/04/2020 0953   CREATININE 0.50 12/04/2020 0953   CALCIUM 9.3 08/23/2019 1420   PROT 6.4 (L) 09/14/2017 0523   ALBUMIN 3.9 09/14/2017 0523   AST 20 09/14/2017 0523   ALT 16 09/14/2017 0523   ALKPHOS 54 09/14/2017 0523   BILITOT 0.8 09/14/2017 0523   GFRNONAA >60 08/23/2019 1420   GFRAA >60 08/23/2019 1420   Lab Results  Component Value Date   WBC 9.0 12/02/2020   NEUTROABS 3.5 10/31/2010   HGB 11.9 (L) 12/04/2020   HCT 35.0 (L) 12/04/2020   MCV 97.2 12/02/2020   PLT 316 12/02/2020     Assessment/Plan Oligodendroglioma, IDH gene mutant and 1p/19q-codeleted ([place]) [C71.9]  Lori Collier is clinically stable  today.  We reviewed goals of care and possible treatment plans again today.  She is concerned about going in for another surgery, especially given the risk of motor dysfunction.    We recommended to repeat an MRI in 2 months to reassess.    We will continue to defer radiochemotherapy.   All questions were answered. The patient knows to call the clinic with any problems, questions or concerns. No barriers to learning were detected.  The total time spent in the encounter was 30 minutes and more than 50% was on counseling and review of test results   Ventura Sellers, MD Medical Director of Neuro-Oncology [facility] [place] 01/06/21 9:47 AM

## 2021-01-09 ENCOUNTER — Other Ambulatory Visit: Payer: Self-pay | Admitting: Radiation Therapy

## 2021-01-22 ENCOUNTER — Ambulatory Visit: Payer: PRIVATE HEALTH INSURANCE | Admitting: Neurology

## 2021-02-25 ENCOUNTER — Encounter: Payer: Self-pay | Admitting: Internal Medicine

## 2021-02-25 DIAGNOSIS — G40009 Localization-related (focal) (partial) idiopathic epilepsy and epileptic syndromes with seizures of localized onset, not intractable, without status epilepticus: Secondary | ICD-10-CM

## 2021-02-25 MED ORDER — LEVETIRACETAM 750 MG PO TABS: 750.0000 mg | ORAL_TABLET | Freq: Two times a day (BID) | ORAL | 5 refills | Status: DC

## 2021-03-07 ENCOUNTER — Ambulatory Visit
Admission: RE | Admit: 2021-03-07 | Discharge: 2021-03-07 | Disposition: A | Discharge status: Home / Self Care | Payer: PRIVATE HEALTH INSURANCE | Source: Ambulatory Visit | Attending: Internal Medicine | Admitting: Internal Medicine

## 2021-03-07 ENCOUNTER — Other Ambulatory Visit: Payer: Self-pay

## 2021-03-07 DIAGNOSIS — C719 Malignant neoplasm of brain, unspecified: Secondary | ICD-10-CM

## 2021-03-07 MED ORDER — GADOBENATE DIMEGLUMINE 529 MG/ML IV SOLN
12.0000 mL | Freq: Once | INTRAVENOUS | Status: AC | PRN
  Administered 2021-03-07: 12 mL via INTRAVENOUS

## 2021-03-11 ENCOUNTER — Inpatient Hospital Stay: Payer: PRIVATE HEALTH INSURANCE | Attending: Neurosurgery | Admitting: Internal Medicine

## 2021-03-11 ENCOUNTER — Other Ambulatory Visit: Payer: Self-pay

## 2021-03-11 VITALS — BP 110/76 | HR 96 | Temp 98.7°F | Resp 16 | Ht 65.0 in | Wt 124.4 lb

## 2021-03-11 DIAGNOSIS — C711 Malignant neoplasm of frontal lobe: Secondary | ICD-10-CM | POA: Diagnosis not present

## 2021-03-11 DIAGNOSIS — C719 Malignant neoplasm of brain, unspecified: Secondary | ICD-10-CM | POA: Diagnosis not present

## 2021-03-11 NOTE — Progress Notes (Signed)
[facility] [place], [place] [address] [phone]   Interval Evaluation  Date of Service: 03/11/21 Patient Name: Lori Collier Patient MRN: [id number] Patient DOB: 1991-07-07 Provider: Ventura Sellers, MD  Identifying Statement:  Lori Collier is a 30 y.o. 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 Marcello Moores.  Path is oligodendroglioma IDH-1 mut and 1p/19q co-deleted     Biomarkers:  MGMT Unknown.  IDH 1/2 Mutated.  EGFR Unknown  1p/19q co-deleted   Interval History:  Lori Collier presents for follow up after recent MRI brain.  She is doing well overall, she describes no new or progressive neurologic deficits.  No seizures or headaches.  Continues to struggle at time with fatigue, anxiety.  H+P (12/24/20) Patient presented to medical attention this past October, when she began to experience new onset seizures.  Seizures are described as shaking with impaired awareness, lasting for minutes.  CNS imaging demonstrated 2 foci of FLAIR signal abnormality (R insular, L frontal).  She then underwent resection of the left frontal lesion with Dr. Marcello Moores on 12/04/20, given high suspicion for primary neoplasm.  Following surgery, she has been seizure-free, continues on Keppra $RemoveB'750mg'[id number]$  BID.  She is fully fucntional and independent, though describes some fatigue and some slowdown in her short term memory.  She presents to clinic today to review pathology and discuss treatment plan options.   Medications: Current Outpatient Medications on File Prior to Visit  Medication Sig Dispense Refill  . albuterol (VENTOLIN HFA) 108 (90 Base) MCG/ACT inhaler Inhale 1 puff into the lungs every 4 (four) hours as needed for shortness of breath.    . fluticasone (FLONASE) 50 MCG/ACT  nasal spray Place 1 spray into both nostrils daily as needed for allergies or rhinitis.    Marland Kitchen ibuprofen (ADVIL) 200 MG tablet Take 400 mg by mouth every 6 (six) hours as needed for moderate pain.    Marland Kitchen levETIRAcetam (KEPPRA) 750 MG tablet Take 1 tablet (750 mg total) by mouth 2 (two) times daily. 60 tablet 5  . loratadine (CLARITIN) 10 MG tablet Take 10 mg by mouth daily as needed for allergies.    . Multiple Vitamins-Minerals (ZINC PO) Take 2 tablets by mouth daily.    . naproxen sodium (ALEVE) 220 MG tablet Take 660 mg by mouth as needed (pain).    . norethindrone (MICRONOR) 0.35 MG tablet Take 1 tablet (0.35 mg total) by mouth daily. 84 tablet 4   No current facility-administered medications on file prior to visit.    Allergies:  Allergies  Allergen Reactions  . Natural Vegetable Orange [Psyllium]     Tingling sensation in the tongue, and itchy throat per patient   Past Medical History:  Past Medical History:  Diagnosis Date  . Anxiety   . Asthma    ALLERGY INDUCED  . Depression   . Endometriosis   . Headache    MIGRAINES  . PCOS (polycystic ovarian syndrome)    Past Surgical History:  Past Surgical History:  Procedure Laterality Date  . APPENDECTOMY  09/2010  . APPLICATION OF CRANIAL NAVIGATION Left 12/04/2020   Procedure: APPLICATION OF CRANIAL NAVIGATION;  Surgeon: Vallarie Mare, MD;  Location: [facility];  Service: Neurosurgery;  Laterality: Left;  . CRANIOTOMY Left 12/04/2020   Procedure: CRANIOTOMY LEFT FRONTAL  RESECTION OF BRAIN LESION;  Surgeon: Vallarie Mare, MD;  Location: [facility];  Service: Neurosurgery;  Laterality: Left;  . ROBOTIC ASSISTED LAPAROSCOPIC OVARIAN CYSTECTOMY Bilateral 12/12/2018   Procedure: XI ROBOTIC ASSISTED LAPAROSCOPIC OVARIAN CYSTECTOMY, CONSERVATIVE TREATMENT OF ENDOMETRIOSIS;  Surgeon: [name], MD;  Location: [facility];  Service: Gynecology;  Laterality: Bilateral;   Social History:  Social History    Socioeconomic History  . Marital status: Single    Spouse name: Not on file  . Number of children: Not on file  . Years of education: Not on file  . Highest education level: Not on file  Occupational History  . Not on file  Tobacco Use  . Smoking status: Current Some Day Smoker  . Smokeless tobacco: Never Used  Vaping Use  . Vaping Use: Every day  . Substances: Nicotine  Substance and Sexual Activity  . Alcohol use: Yes    Comment: occ  . Drug use: Yes    Types: Marijuana  . Sexual activity: Not Currently    Comment: 1st intercourse- 16, partners- 22,   Other Topics Concern  . Not on file  Social History Narrative   Right handed    Lives with boy friend    One story home    Social Determinants of Health   Financial Resource Strain: Not on file  Food Insecurity: Not on file  Transportation Needs: Not on file  Physical Activity: Not on file  Stress: Not on file  Social Connections: Not on file  Intimate Partner Violence: Not on file   Family History:  Family History  Problem Relation Age of Onset  . Hypertension Mother   . Thyroid disease Mother     Review of Systems: Constitutional: Doesn't report fevers, chills or abnormal weight loss Eyes: Doesn't report blurriness of vision Ears, nose, mouth, throat, and face: Doesn't report sore throat Respiratory: Doesn't report cough, dyspnea or wheezes Cardiovascular: Doesn't report palpitation, chest discomfort  Gastrointestinal:  Doesn't report nausea, constipation, diarrhea GU: Doesn't report incontinence Skin: Doesn't report skin rashes Neurological: Per HPI Musculoskeletal: Doesn't report joint pain Behavioral/Psych: Doesn't report anxiety  Physical Exam: Vitals:   03/11/21 1217  BP: 110/76  Pulse: 96  Resp: 16  Temp: 98.7 F (37.1 C)  SpO2: 100%   KPS: 90. General: Alert, cooperative, pleasant, in no acute distress Head: Normal EENT: No conjunctival injection or scleral icterus.  Lungs: Resp  effort normal Cardiac: Regular rate Abdomen: Non-distended abdomen Skin: No rashes cyanosis or petechiae. Extremities: No clubbing or edema  Neurologic Exam: Mental Status: Awake, alert, attentive to examiner. Oriented to self and environment. Language is fluent with intact comprehension.  Cranial Nerves: Visual acuity is grossly normal. Visual fields are full. Extra-ocular movements intact. No ptosis. Face is symmetric Motor: Tone and bulk are normal. Power is full in both arms and legs. Reflexes are symmetric, no pathologic reflexes present.  Sensory: Intact to light touch Gait: Normal.   Labs: I have reviewed the data as listed    Component Value Date/Time   NA 140 12/04/2020 0953   K 3.2 (L) 12/04/2020 0953   CL 107 12/04/2020 0953   CO2 24 08/23/2019 1420   GLUCOSE 151 (H) 12/04/2020 0953   BUN 7 12/04/2020 0953   CREATININE 0.50 12/04/2020 0953   CALCIUM 9.3 08/23/2019 1420   PROT 6.4 (L) 09/14/2017 0523   ALBUMIN 3.9 09/14/2017 0523   AST 20 09/14/2017 0523   ALT 16 09/14/2017 0523   ALKPHOS 54  09/14/2017 0523   BILITOT 0.8 09/14/2017 0523   GFRNONAA >60 08/23/2019 1420   GFRAA >60 08/23/2019 1420   Lab Results  Component Value Date   WBC 9.0 12/02/2020   NEUTROABS 3.5 10/31/2010   HGB 11.9 (L) 12/04/2020   HCT 35.0 (L) 12/04/2020   MCV 97.2 12/02/2020   PLT 316 12/02/2020   Imaging:  [facility] Clinician Interpretation: I have personally reviewed the CNS images as listed.  My interpretation, in the context of the patient's clinical presentation, is stable disease  MR BRAIN W WO CONTRAST  Result Date: 03/07/2021 CLINICAL DATA:  Oligodendroglioma. IDH mutant. Postop resection left frontal tumor. EXAM: MRI HEAD WITHOUT AND WITH CONTRAST TECHNIQUE: Multiplanar, multiecho pulse sequences of the brain and surrounding structures were obtained without and with intravenous contrast. CONTRAST:  27mL MULTIHANCE GADOBENATE DIMEGLUMINE 529 MG/ML IV SOLN COMPARISON:  MRI head  12/05/2020 FINDINGS: Brain: Resection of left frontal tumor. Surgical cavity shows interval contraction. The surgical cavity is well circumscribed with minimal surrounding FLAIR hyperintensity. Postop mild blood products in the cavity. No recurrent tumor. No abnormal enhancement Thickening with T2 and FLAIR hyperintensity in the right insula without enhancement is unchanged from the prior study. Remainder of the brain is normal. Ventricle size is normal. No acute infarct. Vascular: Normal arterial flow voids Skull and upper cervical spine: Left frontal craniotomy otherwise negative. Sinuses/Orbits: Large cyst left maxillary sinus. Mucosal edema throughout the paranasal sinuses. Negative orbit Other: None IMPRESSION: Contracting surgical cavity left medial frontal lobe. No evidence of recurrent tumor Thickening and hyperintensity on T2 and FLAIR in the right insula unchanged. No abnormal enhancement. Probable stable tumor. Electronically Signed   By: Franchot Gallo M.D.   On: 03/07/2021 15:38    Assessment/Plan Oligodendroglioma, IDH gene mutant and 1p/19q-codeleted ([facility]) [C71.9]  Kemba N Greening is clinically and radiographically stable today.  We again reviewed goals of care and possible treatment plans.    She would prefer continued close imaging monitoring to interventions at this time (surgery or radiochemotherapy).  We ask that Lori Collier return to clinic in 3 months following next brain MRI, or sooner as needed.  We will continue to defer radiochemotherapy.   All questions were answered. The patient knows to call the clinic with any problems, questions or concerns. No barriers to learning were detected.  The total time spent in the encounter was 30 minutes and more than 50% was on counseling and review of test results   Ventura Sellers, MD Medical Director of Neuro-Oncology [facility] [place] 03/11/21 3:53 PM

## 2021-03-20 ENCOUNTER — Other Ambulatory Visit: Payer: Self-pay | Admitting: Radiation Therapy

## 2021-04-16 ENCOUNTER — Encounter: Payer: Self-pay | Admitting: Internal Medicine

## 2021-04-17 ENCOUNTER — Telehealth: Payer: Self-pay | Admitting: Genetic Counselor

## 2021-04-17 ENCOUNTER — Encounter: Payer: Self-pay | Admitting: Genetic Counselor

## 2021-04-17 NOTE — Telephone Encounter (Signed)
LM on VM that Dr. Mickeal Skinner asked me to call and set up appointment.  Please CB at her convenience.  Left CB instructions.

## 2021-05-07 ENCOUNTER — Inpatient Hospital Stay: Payer: No Typology Code available for payment source

## 2021-05-07 ENCOUNTER — Encounter: Payer: Self-pay | Admitting: Genetic Counselor

## 2021-05-07 ENCOUNTER — Inpatient Hospital Stay: Payer: No Typology Code available for payment source | Attending: Neurosurgery | Admitting: Genetic Counselor

## 2021-05-07 ENCOUNTER — Other Ambulatory Visit: Payer: Self-pay

## 2021-05-07 DIAGNOSIS — Z8049 Family history of malignant neoplasm of other genital organs: Secondary | ICD-10-CM

## 2021-05-07 DIAGNOSIS — C719 Malignant neoplasm of brain, unspecified: Secondary | ICD-10-CM | POA: Diagnosis not present

## 2021-05-07 DIAGNOSIS — Z809 Family history of malignant neoplasm, unspecified: Secondary | ICD-10-CM

## 2021-05-08 ENCOUNTER — Encounter: Payer: Self-pay | Admitting: Genetic Counselor

## 2021-05-08 DIAGNOSIS — Z809 Family history of malignant neoplasm, unspecified: Secondary | ICD-10-CM | POA: Insufficient documentation

## 2021-05-08 DIAGNOSIS — Z8049 Family history of malignant neoplasm of other genital organs: Secondary | ICD-10-CM | POA: Insufficient documentation

## 2021-05-08 NOTE — Progress Notes (Signed)
REFERRING PROVIDER: Ventura Sellers, MD [place],  [place] [address]  PRIMARY PROVIDER:  Jenny Reichmann, PA-C  PRIMARY REASON FOR VISIT:  1. Family history of cancer   2. Family history of uterine cancer   3. Oligodendroglioma, IDH gene mutant and 1p/19q-codeleted ([facility])      HISTORY OF PRESENT ILLNESS:   Lori Collier, a 30 y.o. female, was seen for a [facility] cancer genetics consultation at the request of Dr. Mickeal Skinner due to a personal and family history of cancer.  Lori Collier presents to clinic today to discuss the possibility of a hereditary predisposition to cancer, genetic testing, and to further clarify her future cancer risks, as well as potential cancer risks for family members.   In February 2022, at the age of 74, Lori Collier was diagnosed with an oligodendroglioma of the left frontal brain. The treatment plan included a craniotomy resection.Marland Kitchen   CANCER HISTORY:  Oncology History  Oligodendroglioma, IDH gene mutant and 1p/19q-codeleted ([facility])  12/04/2020 Surgery   Craniotomy, resection of left frontal mass with Dr. Marcello Moores.  Path is oligodendroglioma IDH-1 mut and 1p/19q co-deleted      RISK FACTORS:  Menarche was at age 60.  First live birth at age N/A.  OCP use for approximately  8-10  years.  Ovaries intact: yes.  Hysterectomy: no.  Menopausal status: postmenopausal.  HRT use: 0 years. Colonoscopy: no; not examined. Mammogram within the last year: no. Number of breast biopsies: 0. Up to date with pelvic exams: yes. Any excessive radiation exposure in the past: no  Past Medical History:  Diagnosis Date   Anxiety    Asthma    ALLERGY INDUCED   Depression    Endometriosis    Family history of cancer    Family history of uterine cancer    Headache    MIGRAINES   PCOS (polycystic ovarian syndrome)     Past Surgical History:  Procedure Laterality Date   APPENDECTOMY  78/6767   APPLICATION OF CRANIAL NAVIGATION Left 12/04/2020   Procedure:  APPLICATION OF CRANIAL NAVIGATION;  Surgeon: Vallarie Mare, MD;  Location: [facility];  Service: Neurosurgery;  Laterality: Left;   CRANIOTOMY Left 12/04/2020   Procedure: CRANIOTOMY LEFT FRONTAL RESECTION OF BRAIN LESION;  Surgeon: Vallarie Mare, MD;  Location: [facility];  Service: Neurosurgery;  Laterality: Left;   ROBOTIC ASSISTED LAPAROSCOPIC OVARIAN CYSTECTOMY Bilateral 12/12/2018   Procedure: XI ROBOTIC ASSISTED LAPAROSCOPIC OVARIAN CYSTECTOMY, CONSERVATIVE TREATMENT OF ENDOMETRIOSIS;  Surgeon: [name], MD;  Location: [facility];  Service: Gynecology;  Laterality: Bilateral;    Social History   Socioeconomic History   Marital status: Single    Spouse name: Not on file   Number of children: Not on file   Years of education: Not on file   Highest education level: Not on file  Occupational History   Not on file  Tobacco Use   Smoking status: Some Days   Smokeless tobacco: Never  Vaping Use   Vaping Use: Every day   Substances: Nicotine  Substance and Sexual Activity   Alcohol use: Yes    Comment: occ   Drug use: Yes    Types: Marijuana   Sexual activity: Not Currently    Comment: 1st intercourse- 16, partners- 22,   Other Topics Concern   Not on file  Social History Narrative   Right handed    Lives with boy friend    One story home    Social Determinants  of Health   Financial Resource Strain: Not on file  Food Insecurity: Not on file  Transportation Needs: Not on file  Physical Activity: Not on file  Stress: Not on file  Social Connections: Not on file     FAMILY HISTORY:  We obtained a detailed, 4-generation family history.  Significant diagnoses are listed below: Family History  Problem Relation Age of Onset   Hypertension Mother    Thyroid disease Mother    Cancer Father 16       liver, brain and lung cancer, d. 77   Thyroid cancer Paternal Grandmother 79   Uterine cancer Other 80       PGFs sister   Lymphoma Other 61    Prostate cancer Other        MGFs brother,    The patient does not have children.  She has three maternal half sisters and two paternal half brothers and a paternal half sister who are all cancer free. Her mother is living and her father is deceased.  The patient's father was diagnosed with late stage cancer in his liver, brain and lung at age 22 and died soon afterwards.  He had one brother who is cancer free.  His parents are living.  His mother had thyroid cancer at 79 and his father is cancer free.  His father's brother had arachnoid cysts and his sister had uterine cancer at 4 and lymphoma at 53.  The patient's mother has a parathyroid nodule/tumor at 37.  She has two brothers and a sister who are cancer free.  Her mother died of a brain hemorrhage and her father is living.  Lori Collier is unaware of previous family history of genetic testing for hereditary cancer risks. Patient's maternal ancestors are of Caucasian and [place] descent, and paternal ancestors are of Caucasian descent. There is no reported Ashkenazi Jewish ancestry. There is no known consanguinity.  GENETIC COUNSELING ASSESSMENT: Lori Collier is a 30 y.o. female with a personal and family history of cancer which is somewhat suggestive of a hereditary cancer syndrome and predisposition to cancer given the young ages of onset of cancer and the combination of cancer. We, therefore, discussed and recommended the following at today's visit.   DISCUSSION: We discussed that 5 - 10% of cancer is hereditary, with each type of cancer having it's own risk for being hereditary.  Between the combination of the patient's great aunt with uterine cancer at 67,  the patient's brain cancer and her father's young age of onset of cancer it is suspicious of a possibly Lynch syndrome or CMMR. However, sometimes individuals with a young age of onset of brain cancer have a TP53 mutation that increases the risk for soft tissue cancers.  We discussed that  testing is beneficial for several reasons including knowing how to follow individuals after completing their treatment, and understand if other family members could be at risk for cancer and allow them to undergo genetic testing.   We reviewed the characteristics, features and inheritance patterns of hereditary cancer syndromes. We also discussed genetic testing, including the appropriate family members to test, the process of testing, insurance coverage and turn-around-time for results. We discussed the implications of a negative, positive, carrier and/or variant of uncertain significant result. We recommended Lori Collier pursue genetic testing for the Multi cancer and brain cancer gene panel.   Based on Lori Collier's personal and family history of cancer, she meets medical criteria for genetic testing. Despite that she meets criteria, she  may still have an out of pocket cost. We discussed that if her out of pocket cost for testing is over $100, the laboratory will call and confirm whether she wants to proceed with testing.  If the out of pocket cost of testing is less than $100 she will be billed by the genetic testing laboratory.   PLAN: After considering the risks, benefits, and limitations, Lori Collier provided informed consent to pursue genetic testing and the blood sample was sent to [facility] for analysis of the Multi-cancer and Brain Cancer panel. Results should be available within approximately 2-3 weeks' time, at which point they will be disclosed by telephone to Lori Collier, as will any additional recommendations warranted by these results. Lori Collier will receive a summary of her genetic counseling visit and a copy of her results once available. This information will also be available in Epic.   Lastly, we encouraged Lori Collier to remain in contact with cancer genetics annually so that we can continuously update the family history and inform her of any changes in cancer genetics and testing  that may be of benefit for this family.   Lori Collier questions were answered to her satisfaction today. Our contact information was provided should additional questions or concerns arise. Thank you for the referral and allowing [place] to share in the care of your patient.   Berley Gambrell P. Florene Glen, [place], [facility] Licensed, Insurance risk surveyor [email or web address] phone: [phone]  The patient was seen for a total of 40 minutes in face-to-face genetic counseling.  The patient was seen alone.  This patient was discussed with Drs. Magrinat, Lindi Adie and/or Burr Medico who agrees with the above.    _______________________________________________________________________ For Office Staff:  Number of people involved in session: 1 Was an Intern/ student involved with case: no

## 2021-05-19 ENCOUNTER — Encounter: Payer: Self-pay | Admitting: Internal Medicine

## 2021-05-27 ENCOUNTER — Telehealth: Payer: Self-pay | Admitting: Genetic Counselor

## 2021-05-27 ENCOUNTER — Ambulatory Visit: Payer: Self-pay | Admitting: Genetic Counselor

## 2021-05-27 DIAGNOSIS — Z1379 Encounter for other screening for genetic and chromosomal anomalies: Secondary | ICD-10-CM

## 2021-05-27 DIAGNOSIS — C719 Malignant neoplasm of brain, unspecified: Secondary | ICD-10-CM

## 2021-05-27 NOTE — Progress Notes (Signed)
HPI:  Lori Collier was previously seen in the [place] clinic due to a personal and family history of cancer and concerns regarding a hereditary predisposition to cancer. Please refer to our prior cancer genetics clinic note for more information regarding our discussion, assessment and recommendations, at the time. Lori Collier recent genetic test results were disclosed to her, as were recommendations warranted by these results. These results and recommendations are discussed in more detail below.  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 Marcello Moores.  Path is oligodendroglioma IDH-1 mut and 1p/19q co-deleted   05/26/2021 Genetic Testing   Negative genetic testing on the Multicancer panel+RNA.  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  The report date is May 26, 2021.  The Multi-Gene Panel offered by [facility] includes sequencing and/or deletion duplication testing of the following 84 genes: AIP, ALK, APC, ATM, AXIN2,BAP1,  BARD1, BLM, BMPR1A, BRCA1, BRCA2, BRIP1, CASR, CDC73, CDH1, CDK4, CDKN1B, CDKN1C, CDKN2A (p14ARF), CDKN2A (p16INK4a), CEBPA, CHEK2, CTNNA1, DICER1, DIS3L2, EGFR (c.2369C>T, p.Thr790Met variant only), EPCAM (Deletion/duplication testing only), FH, FLCN, GATA2, GPC3, GREM1 (Promoter region deletion/duplication testing only), HOXB13 (c.251G>A, p.Gly84Glu), HRAS, KIT, MAX, MEN1, MET, MITF (c.952G>A, p.Glu318Lys variant only), MLH1, MSH2, MSH3, MSH6, MUTYH, NBN, NF1, NF2, NTHL1, PALB2, PDGFRA, PHOX2B, PMS2, POLD1, POLE, POT1, PRKAR1A, PTCH1, PTEN, RAD50, RAD51C, RAD51D, RB1, RECQL4, RET, RUNX1, SDHAF2, SDHA (sequence changes only), SDHB, SDHC, SDHD, SMAD4, SMARCA4, SMARCB1, SMARCE1, STK11, SUFU, TERC, TERT, TMEM127, TP53, TSC1, TSC2, VHL, WRN and WT1.       FAMILY HISTORY:  We obtained a detailed, 4-generation family history.  Significant  diagnoses are listed below: Family History  Problem Relation Age of Onset   Hypertension Mother    Thyroid disease Mother    Cancer Father 39       liver, brain and lung cancer, d. 75   Thyroid cancer Paternal Grandmother 52   Uterine cancer Other 73       PGFs sister   Lymphoma Other 72   Prostate cancer Other        MGFs brother,    The patient does not have children.  She has three maternal half sisters and two paternal half brothers and a paternal half sister who are all cancer free. Her mother is living and her father is deceased.   The patient's father was diagnosed with late stage cancer in his liver, brain and lung at age 68 and died soon afterwards.  He had one brother who is cancer free.  His parents are living.  His mother had thyroid cancer at 51 and his father is cancer free.  His father's brother had arachnoid cysts and his sister had uterine cancer at 77 and lymphoma at 40.   The patient's mother has a parathyroid nodule/tumor at 26.  She has two brothers and a sister who are cancer free.  Her mother died of a brain hemorrhage and her father is living.   Lori Collier is unaware of previous family history of genetic testing for hereditary cancer risks. Patient's maternal ancestors are of Caucasian and [place] descent, and paternal ancestors are of Caucasian descent. There is no reported Ashkenazi Jewish ancestry. There is no known consanguinity.  GENETIC TEST RESULTS: Genetic testing reported out on May 26, 2021 through the Multi-cancer panel +RNA found no pathogenic mutations. The Multi-Gene Panel offered by [facility] includes sequencing and/or deletion duplication testing of the following 84  genes: AIP, ALK, APC, ATM, AXIN2,BAP1,  BARD1, BLM, BMPR1A, BRCA1, BRCA2, BRIP1, CASR, CDC73, CDH1, CDK4, CDKN1B, CDKN1C, CDKN2A (p14ARF), CDKN2A (p16INK4a), CEBPA, CHEK2, CTNNA1, DICER1, DIS3L2, EGFR (c.2369C>T, p.Thr790Met variant only), EPCAM (Deletion/duplication testing only), FH, FLCN,  GATA2, GPC3, GREM1 (Promoter region deletion/duplication testing only), HOXB13 (c.251G>A, p.Gly84Glu), HRAS, KIT, MAX, MEN1, MET, MITF (c.952G>A, p.Glu318Lys variant only), MLH1, MSH2, MSH3, MSH6, MUTYH, NBN, NF1, NF2, NTHL1, PALB2, PDGFRA, PHOX2B, PMS2, POLD1, POLE, POT1, PRKAR1A, PTCH1, PTEN, RAD50, RAD51C, RAD51D, RB1, RECQL4, RET, RUNX1, SDHAF2, SDHA (sequence changes only), SDHB, SDHC, SDHD, SMAD4, SMARCA4, SMARCB1, SMARCE1, STK11, SUFU, TERC, TERT, TMEM127, TP53, TSC1, TSC2, VHL, WRN and WT1. The test report has been scanned into EPIC and is located under the Molecular Pathology section of the Results Review tab.  A portion of the result report is included below for reference.     We discussed with Lori Collier that because current genetic testing is not perfect, it is possible there may be a gene mutation in one of these genes that current testing cannot detect, but that chance is small.  We also discussed, that there could be another gene that has not yet been discovered, or that we have not yet tested, that is responsible for the cancer diagnoses in the family. It is also possible there is a hereditary cause for the cancer in the family that Lori Collier did not inherit and therefore was not identified in her testing.  Therefore, it is important to remain in touch with cancer genetics in the future so that we can continue to offer Lori Collier the most up to date genetic testing.   Genetic testing did identify a variant of uncertain significance (VUS) was identified in the POT1 gene called c.475C>A.  At this time, it is unknown if this variant is associated with increased cancer risk or if this is a normal finding, but most variants such as this get reclassified to being inconsequential. It should not be used to make medical management decisions. With time, we suspect the lab will determine the significance of this variant, if any. If we do learn more about it, we will try to contact Lori Collier to discuss it  further. However, it is important to stay in touch with [place] periodically and keep the address and phone number up to date.  Based on the diagnosis of oligodendroglioma in the patient and the POT1 VUS being associated with gliomas we discussed getting more information on her family including whether anyone was diagnosed with melanoma, if we could obtain a copy of her paternal great aunt's genetic testing to determine if POT1 was tested, and the type and potential treatment of her maternal great uncle's leukemia.  ADDITIONAL GENETIC TESTING: We discussed with Lori Collier that her genetic testing was fairly extensive.  If there are genes identified to increase cancer risk that can be analyzed in the future, we would be happy to discuss and coordinate this testing at that time.    CANCER SCREENING RECOMMENDATIONS: Lori Collier test result is considered negative (normal).  This means that we have not identified a hereditary cause for her personal and family history of cancer at this time. Most cancers happen by chance and this negative test suggests that her cancer may fall into this category.    While reassuring, this does not definitively rule out a hereditary predisposition to cancer. It is still possible that there could be genetic mutations that are undetectable by current technology. There could be genetic mutations in genes  that have not been tested or identified to increase cancer risk.  Therefore, it is recommended she continue to follow the cancer management and screening guidelines provided by her oncology and primary healthcare provider.   An individual's cancer risk and medical management are not determined by genetic test results alone. Overall cancer risk assessment incorporates additional factors, including personal medical history, family history, and any available genetic information that may result in a personalized plan for cancer prevention and surveillance  RECOMMENDATIONS FOR FAMILY  MEMBERS:  Individuals in this family might be at some increased risk of developing cancer, over the general population risk, simply due to the family history of cancer.  We recommended women in this family have a yearly mammogram beginning at age 86, or 59 years younger than the earliest onset of cancer, an annual clinical breast exam, and perform monthly breast self-exams. Women in this family should also have a gynecological exam as recommended by their primary provider. All family members should be referred for colonoscopy starting at age 93.  FOLLOW-UP: Lastly, we discussed with Lori Collier that cancer genetics is a rapidly advancing field and it is possible that new genetic tests will be appropriate for her and/or her family members in the future. We encouraged her to remain in contact with cancer genetics on an annual basis so we can update her personal and family histories and let her know of advances in cancer genetics that may benefit this family.   Our contact number was provided. Lori Collier questions were answered to her satisfaction, and she knows she is welcome to call us at anytime with additional questions or concerns.   Roma Kayser, [place], [facility] Licensed, Certified Genetic Counselor [email or web address]

## 2021-05-27 NOTE — Telephone Encounter (Signed)
Revealed negative genetic testing.  Discussed that we do not know why she has an oligodendroglioma or why there is cancer in the family. It could be due to a different gene that we are not testing, or maybe our current technology may not be able to pick something up.  It will be important for her to keep in contact with genetics to keep up with whether additional testing may be needed.  POT1 VUS was identified on genetic testing.  As discussed with the patient, POT1 is associated with gliomas.  However, most VUSs are benign.  She will attempt to obtain some information on her family members including whether anyone had melanoma, the type of leukemia and the treatment for it that her uncle had, and her great aunt's genetic test report.

## 2021-06-06 ENCOUNTER — Ambulatory Visit
Admission: RE | Admit: 2021-06-06 | Discharge: 2021-06-06 | Disposition: A | Discharge status: Home / Self Care | Payer: No Typology Code available for payment source | Source: Ambulatory Visit | Attending: Internal Medicine | Admitting: Internal Medicine

## 2021-06-06 ENCOUNTER — Other Ambulatory Visit: Payer: Self-pay

## 2021-06-06 DIAGNOSIS — C719 Malignant neoplasm of brain, unspecified: Secondary | ICD-10-CM

## 2021-06-06 MED ORDER — GADOBENATE DIMEGLUMINE 529 MG/ML IV SOLN
11.0000 mL | Freq: Once | INTRAVENOUS | Status: AC | PRN
  Administered 2021-06-06: 11 mL via INTRAVENOUS

## 2021-06-09 ENCOUNTER — Inpatient Hospital Stay: Payer: No Typology Code available for payment source | Attending: Neurosurgery

## 2021-06-12 ENCOUNTER — Other Ambulatory Visit: Payer: Self-pay

## 2021-06-12 ENCOUNTER — Inpatient Hospital Stay: Payer: No Typology Code available for payment source | Attending: Neurosurgery | Admitting: Internal Medicine

## 2021-06-12 VITALS — BP 115/77 | HR 101 | Temp 98.2°F | Resp 16 | Ht 65.0 in | Wt 114.9 lb

## 2021-06-12 DIAGNOSIS — C719 Malignant neoplasm of brain, unspecified: Secondary | ICD-10-CM

## 2021-06-12 DIAGNOSIS — C711 Malignant neoplasm of frontal lobe: Secondary | ICD-10-CM | POA: Diagnosis not present

## 2021-06-12 NOTE — Progress Notes (Signed)
[facility] [place], [place] [address] [phone]   Interval Evaluation  Date of Service: 06/12/21 Patient Name: Lori Collier Patient MRN: [id number] Patient DOB: 1991/05/11 Provider: Ventura Sellers, MD  Identifying Statement:  Lori Collier is a 30 y.o. 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 Marcello Moores.  Path is oligodendroglioma IDH-1 mut and 1p/19q co-deleted   05/26/2021 Genetic Testing   Negative genetic testing on the Multicancer panel+RNA.  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  The report date is May 26, 2021.  The Multi-Gene Panel offered by [facility] includes sequencing and/or deletion duplication testing of the following 84 genes: AIP, ALK, APC, ATM, AXIN2,BAP1,  BARD1, BLM, BMPR1A, BRCA1, BRCA2, BRIP1, CASR, CDC73, CDH1, CDK4, CDKN1B, CDKN1C, CDKN2A (p14ARF), CDKN2A (p16INK4a), CEBPA, CHEK2, CTNNA1, DICER1, DIS3L2, EGFR (c.2369C>T, p.Thr790Met variant only), EPCAM (Deletion/duplication testing only), FH, FLCN, GATA2, GPC3, GREM1 (Promoter region deletion/duplication testing only), HOXB13 (c.251G>A, p.Gly84Glu), HRAS, KIT, MAX, MEN1, MET, MITF (c.952G>A, p.Glu318Lys variant only), MLH1, MSH2, MSH3, MSH6, MUTYH, NBN, NF1, NF2, NTHL1, PALB2, PDGFRA, PHOX2B, PMS2, POLD1, POLE, POT1, PRKAR1A, PTCH1, PTEN, RAD50, RAD51C, RAD51D, RB1, RECQL4, RET, RUNX1, SDHAF2, SDHA (sequence changes only), SDHB, SDHC, SDHD, SMAD4, SMARCA4, SMARCB1, SMARCE1, STK11, SUFU, TERC, TERT, TMEM127, TP53, TSC1, TSC2, VHL, WRN and WT1.       Biomarkers:  MGMT Unknown.  IDH 1/2 Mutated.  EGFR Unknown  1p/19q co-deleted   Interval History:  Lori Collier presents for follow up after recent MRI brain.  She is doing well overall, she describes no new or progressive  neurologic deficits.  No seizures or headaches.  Anxiety continues to be a problem, she is now taking Ativan on occasion for panic attacks.    H+P (12/24/20) Patient presented to medical attention this past October, when she began to experience new onset seizures.  Seizures are described as shaking with impaired awareness, lasting for minutes.  CNS imaging demonstrated 2 foci of FLAIR signal abnormality (R insular, L frontal).  She then underwent resection of the left frontal lesion with Dr. Marcello Moores on 12/04/20, given high suspicion for primary neoplasm.  Following surgery, she has been seizure-free, continues on Keppra 766m BID.  She is fully fucntional and independent, though describes some fatigue and some slowdown in her short term memory.  She presents to clinic today to review pathology and discuss treatment plan options.   Medications: Current Outpatient Medications on File Prior to Visit  Medication Sig Dispense Refill   albuterol (VENTOLIN HFA) 108 (90 Base) MCG/ACT inhaler Inhale 1 puff into the lungs every 4 (four) hours as needed for shortness of breath.     fluticasone (FLONASE) 50 MCG/ACT nasal spray Place 1 spray into both nostrils daily as needed for allergies or rhinitis.     ibuprofen (ADVIL) 200 MG tablet Take 400 mg by mouth every 6 (six) hours as needed for moderate pain.     levETIRAcetam (KEPPRA) 750 MG tablet Take 1 tablet (750 mg total) by mouth 2 (two) times daily. 60 tablet 5   loratadine (CLARITIN) 10 MG tablet Take 10 mg by mouth daily as needed for allergies.     Multiple Vitamins-Minerals (ZINC PO) Take 2 tablets by mouth daily.     naproxen sodium (ALEVE) 220 MG tablet Take 660 mg by mouth as  needed (pain).     norethindrone (MICRONOR) 0.35 MG tablet Take 1 tablet (0.35 mg total) by mouth daily. 84 tablet 4   No current facility-administered medications on file prior to visit.    Allergies:  Allergies  Allergen Reactions   Natural Vegetable Orange [Psyllium]      Tingling sensation in the tongue, and itchy throat per patient   Past Medical History:  Past Medical History:  Diagnosis Date   Anxiety    Asthma    ALLERGY INDUCED   Depression    Endometriosis    Family history of cancer    Family history of uterine cancer    Headache    MIGRAINES   PCOS (polycystic ovarian syndrome)    Past Surgical History:  Past Surgical History:  Procedure Laterality Date   APPENDECTOMY  12/2120   APPLICATION OF CRANIAL NAVIGATION Left 12/04/2020   Procedure: APPLICATION OF CRANIAL NAVIGATION;  Surgeon: Vallarie Mare, MD;  Location: [facility];  Service: Neurosurgery;  Laterality: Left;   CRANIOTOMY Left 12/04/2020   Procedure: CRANIOTOMY LEFT FRONTAL RESECTION OF BRAIN LESION;  Surgeon: Vallarie Mare, MD;  Location: [facility];  Service: Neurosurgery;  Laterality: Left;   ROBOTIC ASSISTED LAPAROSCOPIC OVARIAN CYSTECTOMY Bilateral 12/12/2018   Procedure: XI ROBOTIC ASSISTED LAPAROSCOPIC OVARIAN CYSTECTOMY, CONSERVATIVE TREATMENT OF ENDOMETRIOSIS;  Surgeon: [name], MD;  Location: [facility];  Service: Gynecology;  Laterality: Bilateral;   Social History:  Social History   Socioeconomic History   Marital status: Single    Spouse name: Not on file   Number of children: Not on file   Years of education: Not on file   Highest education level: Not on file  Occupational History   Not on file  Tobacco Use   Smoking status: Some Days   Smokeless tobacco: Never  Vaping Use   Vaping Use: Every day   Substances: Nicotine  Substance and Sexual Activity   Alcohol use: Yes    Comment: occ   Drug use: Yes    Types: Marijuana   Sexual activity: Not Currently    Comment: 1st intercourse- 16, partners- 22,   Other Topics Concern   Not on file  Social History Narrative   Right handed    Lives with boy friend    One story home    Social Determinants of Health   Financial Resource Strain: Not on file  Food Insecurity: Not on file   Transportation Needs: Not on file  Physical Activity: Not on file  Stress: Not on file  Social Connections: Not on file  Intimate Partner Violence: Not on file   Family History:  Family History  Problem Relation Age of Onset   Hypertension Mother    Thyroid disease Mother    Cancer Father 52       liver, brain and lung cancer, d. 50   Thyroid cancer Paternal Grandmother 70   Uterine cancer Other 19       PGFs sister   Lymphoma Other 23   Prostate cancer Other        MGFs brother,    Review of Systems: Constitutional: Doesn't report fevers, chills or abnormal weight loss Eyes: Doesn't report blurriness of vision Ears, nose, mouth, throat, and face: Doesn't report sore throat Respiratory: Doesn't report cough, dyspnea or wheezes Cardiovascular: Doesn't report palpitation, chest discomfort  Gastrointestinal:  Doesn't report nausea, constipation, diarrhea GU: Doesn't report incontinence Skin: Doesn't report skin rashes Neurological: Per HPI Musculoskeletal: Doesn't report joint  pain Behavioral/Psych: Doesn't report anxiety  Physical Exam: Vitals:   06/12/21 1146  BP: 115/77  Pulse: (!) 101  Resp: 16  Temp: 98.2 F (36.8 C)  SpO2: 100%   KPS: 90. General: Alert, cooperative, pleasant, in no acute distress Head: Normal EENT: No conjunctival injection or scleral icterus.  Lungs: Resp effort normal Cardiac: Regular rate Abdomen: Non-distended abdomen Skin: No rashes cyanosis or petechiae. Extremities: No clubbing or edema  Neurologic Exam: Mental Status: Awake, alert, attentive to examiner. Oriented to self and environment. Language is fluent with intact comprehension.  Cranial Nerves: Visual acuity is grossly normal. Visual fields are full. Extra-ocular movements intact. No ptosis. Face is symmetric Motor: Tone and bulk are normal. Power is full in both arms and legs. Reflexes are symmetric, no pathologic reflexes present.  Sensory: Intact to light touch Gait:  Normal.   Labs: I have reviewed the data as listed    Component Value Date/Time   NA 140 12/04/2020 0953   K 3.2 (L) 12/04/2020 0953   CL 107 12/04/2020 0953   CO2 24 08/23/2019 1420   GLUCOSE 151 (H) 12/04/2020 0953   BUN 7 12/04/2020 0953   CREATININE 0.50 12/04/2020 0953   CALCIUM 9.3 08/23/2019 1420   PROT 6.4 (L) 09/14/2017 0523   ALBUMIN 3.9 09/14/2017 0523   AST 20 09/14/2017 0523   ALT 16 09/14/2017 0523   ALKPHOS 54 09/14/2017 0523   BILITOT 0.8 09/14/2017 0523   GFRNONAA >60 08/23/2019 1420   GFRAA >60 08/23/2019 1420   Lab Results  Component Value Date   WBC 9.0 12/02/2020   NEUTROABS 3.5 10/31/2010   HGB 11.9 (L) 12/04/2020   HCT 35.0 (L) 12/04/2020   MCV 97.2 12/02/2020   PLT 316 12/02/2020   Imaging:  [place] [place] Clinician Interpretation: I have personally reviewed the CNS images as listed.  My interpretation, in the context of the patient's clinical presentation, is stable disease  MR BRAIN W WO CONTRAST  Result Date: 06/06/2021 CLINICAL DATA:  Neoplasm, assess treatment response EXAM: MRI HEAD WITHOUT AND WITH CONTRAST TECHNIQUE: Multiplanar, multiecho pulse sequences of the brain and surrounding structures were obtained without and with intravenous contrast. CONTRAST:  56mL MULTIHANCE GADOBENATE DIMEGLUMINE 529 MG/ML IV SOLN COMPARISON:  03/07/2021 FINDINGS: Brain: Status post left frontal craniotomy with subjacent resection cavity. Slightly increased surrounding T2 hyperintense signal at the anterolateral margin (series 8, image 21), as well as possibly at the superior aspect (series 8, image 24). No abnormal enhancement. Unchanged T2 hyperintense signal and thickening in the right insula, without abnormal enhancement. No acute infarction, hemorrhage, hydrocephalus, extra-axial collection, or midline shift. Vascular: Normal flow voids. Skull and upper cervical spine: Left frontal craniotomy. Otherwise unremarkable. Sinuses/Orbits: Left maxillary mucous retention  cyst. Mucosal thickening in the ethmoid air cells. The orbits are unremarkable. Other: The mastoids are well aerated. IMPRESSION: 1. Status post left frontal craniotomy with slightly increased T2 hyperintense signal at the anterolateral margin and superior margin clavicle which is nonspecific but could be seen in the setting recurrent/residual tumor. Attention on follow-up. 2. Unchanged T2 hyperintense signal in the right insula, which may represent an unchanged low-grade glioma versus cortical dysplasia. Electronically Signed   By: Merilyn Baba M.D.   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  MRI demonstrates subtle changes along left frontal resection cavity, R insular lesion is stale.  Etiology may be post-surgical in nature due  to some collapse of resection cavity, or alternately modest tumor recurrence/growth.    Case was reviewed in detail in brian/spine tumor board this week; recommendation was to repeat imaging for better characterization.  We will continue to defer radiochemotherapy.   We ask that Lori Collier return to clinic in 3 months following next brain MRI, or sooner as needed.  All questions were answered. The patient knows to call the clinic with any problems, questions or concerns. No barriers to learning were detected.  The total time spent in the encounter was 30 minutes and more than 50% was on counseling and review of test results   Ventura Sellers, MD Medical Director of Neuro-Oncology [facility] [place] [facility] 06/12/21 11:43 AM

## 2021-06-13 ENCOUNTER — Telehealth: Payer: Self-pay | Admitting: Internal Medicine

## 2021-06-13 NOTE — Telephone Encounter (Signed)
Scheduled appt per 9/1 los. Called pt, no answer. Left msg with appt date and time.  

## 2021-06-18 ENCOUNTER — Telehealth: Payer: Self-pay | Admitting: Genetic Counselor

## 2021-06-18 NOTE — Telephone Encounter (Signed)
LM on VM with updated information from the lab based on her updated family history.  The updated Fh will not change any classification at this time.  The POT1 VUS is still a VUS.  This will not change her medical management at this time. We will notify her once this VUS has been reclassified.  It could take anywhere from a couple months to several years.

## 2021-07-28 ENCOUNTER — Encounter: Payer: Self-pay | Admitting: Internal Medicine

## 2021-08-28 ENCOUNTER — Encounter: Payer: Self-pay | Admitting: *Deleted

## 2021-08-28 ENCOUNTER — Other Ambulatory Visit: Payer: Self-pay | Admitting: Internal Medicine

## 2021-08-28 DIAGNOSIS — G40009 Localization-related (focal) (partial) idiopathic epilepsy and epileptic syndromes with seizures of localized onset, not intractable, without status epilepticus: Secondary | ICD-10-CM

## 2021-09-01 ENCOUNTER — Inpatient Hospital Stay: Payer: No Typology Code available for payment source | Admitting: Internal Medicine

## 2021-09-11 ENCOUNTER — Other Ambulatory Visit: Payer: Self-pay | Admitting: Radiation Therapy

## 2021-09-15 ENCOUNTER — Ambulatory Visit: Payer: No Typology Code available for payment source | Admitting: Internal Medicine

## 2021-09-22 ENCOUNTER — Inpatient Hospital Stay: Payer: No Typology Code available for payment source | Admitting: Internal Medicine

## 2021-09-23 ENCOUNTER — Ambulatory Visit
Admission: RE | Admit: 2021-09-23 | Discharge: 2021-09-23 | Disposition: A | Discharge status: Home / Self Care | Payer: No Typology Code available for payment source | Source: Ambulatory Visit | Attending: Internal Medicine | Admitting: Internal Medicine

## 2021-09-23 ENCOUNTER — Other Ambulatory Visit: Payer: Self-pay

## 2021-09-23 DIAGNOSIS — C719 Malignant neoplasm of brain, unspecified: Secondary | ICD-10-CM

## 2021-09-23 MED ORDER — GADOBENATE DIMEGLUMINE 529 MG/ML IV SOLN
10.0000 mL | Freq: Once | INTRAVENOUS | Status: AC | PRN
  Administered 2021-09-23: 10 mL via INTRAVENOUS

## 2021-09-25 ENCOUNTER — Other Ambulatory Visit: Payer: Self-pay

## 2021-09-25 ENCOUNTER — Inpatient Hospital Stay: Payer: No Typology Code available for payment source | Attending: Neurosurgery | Admitting: Internal Medicine

## 2021-09-25 VITALS — BP 110/70 | HR 48 | Temp 97.7°F | Resp 20 | Wt 113.3 lb

## 2021-09-25 DIAGNOSIS — C711 Malignant neoplasm of frontal lobe: Secondary | ICD-10-CM | POA: Diagnosis present

## 2021-09-25 DIAGNOSIS — C719 Malignant neoplasm of brain, unspecified: Secondary | ICD-10-CM

## 2021-09-25 DIAGNOSIS — G40009 Localization-related (focal) (partial) idiopathic epilepsy and epileptic syndromes with seizures of localized onset, not intractable, without status epilepticus: Secondary | ICD-10-CM

## 2021-09-25 MED ORDER — LEVETIRACETAM 500 MG PO TABS: 500.0000 mg | ORAL_TABLET | Freq: Two times a day (BID) | ORAL | 3 refills | Status: AC

## 2021-09-25 NOTE — Progress Notes (Signed)
[name], [place] [address] [phone]   Interval Evaluation  Date of Service: 09/25/21 Patient Name: Lori Collier Patient MRN: [id number] Patient DOB: Oct 31, 1990 Provider: Ventura Sellers, MD  Identifying Statement:  Lori Collier is a 30 y.o. 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 Marcello Moores.  Path is oligodendroglioma IDH-1 mut and 1p/19q co-deleted   05/26/2021 Genetic Testing   Negative genetic testing on the Multicancer panel+RNA.  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  The report date is May 26, 2021.  The Multi-Gene Panel offered by [facility] includes sequencing and/or deletion duplication testing of the following 84 genes: AIP, ALK, APC, ATM, AXIN2,BAP1,  BARD1, BLM, BMPR1A, BRCA1, BRCA2, BRIP1, CASR, CDC73, CDH1, CDK4, CDKN1B, CDKN1C, CDKN2A (p14ARF), CDKN2A (p16INK4a), CEBPA, CHEK2, CTNNA1, DICER1, DIS3L2, EGFR (c.2369C>T, p.Thr790Met variant only), EPCAM (Deletion/duplication testing only), FH, FLCN, GATA2, GPC3, GREM1 (Promoter region deletion/duplication testing only), HOXB13 (c.251G>A, p.Gly84Glu), HRAS, KIT, MAX, MEN1, MET, MITF (c.952G>A, p.Glu318Lys variant only), MLH1, MSH2, MSH3, MSH6, MUTYH, NBN, NF1, NF2, NTHL1, PALB2, PDGFRA, PHOX2B, PMS2, POLD1, POLE, POT1, PRKAR1A, PTCH1, PTEN, RAD50, RAD51C, RAD51D, RB1, RECQL4, RET, RUNX1, SDHAF2, SDHA (sequence changes only), SDHB, SDHC, SDHD, SMAD4, SMARCA4, SMARCB1, SMARCE1, STK11, SUFU, TERC, TERT, TMEM127, TP53, TSC1, TSC2, VHL, WRN and WT1.       Biomarkers:  MGMT Unknown.  IDH 1/2 Mutated.  EGFR Unknown  1p/19q co-deleted   Interval History:  Lori Collier presents for follow up after recent MRI brain.  Denies new or progressive deficits today.  Appetite has been  poor, she attributes this to increased stress (financial, social).  No seizures or headaches.    H+P (12/24/20) Patient presented to medical attention this past October, when she began to experience new onset seizures.  Seizures are described as shaking with impaired awareness, lasting for minutes.  CNS imaging demonstrated 2 foci of FLAIR signal abnormality (R insular, L frontal).  She then underwent resection of the left frontal lesion with Dr. Marcello Moores on 12/04/20, given high suspicion for primary neoplasm.  Following surgery, she has been seizure-free, continues on Keppra $RemoveB'750mg'EbTdLTcq$  BID.  She is fully fucntional and independent, though describes some fatigue and some slowdown in her short term memory.  She presents to clinic today to review pathology and discuss treatment plan options.   Medications: Current Outpatient Medications on File Prior to Visit  Medication Sig Dispense Refill   albuterol (VENTOLIN HFA) 108 (90 Base) MCG/ACT inhaler Inhale 1 puff into the lungs every 4 (four) hours as needed for shortness of breath.     escitalopram (LEXAPRO) 5 MG tablet Take 1 tablet by mouth daily.     fluticasone (FLONASE) 50 MCG/ACT nasal spray Place 1 spray into both nostrils daily as needed for allergies or rhinitis.     ibuprofen (ADVIL) 200 MG tablet Take 400 mg by mouth every 6 (six) hours as needed for moderate pain.     loratadine (CLARITIN) 10 MG tablet Take 10 mg by mouth daily as needed for allergies.     Multiple Vitamins-Minerals (ZINC PO) Take 2 tablets by mouth daily.     naproxen sodium (ALEVE) 220 MG tablet Take 660 mg by mouth as needed (pain).     norethindrone (MICRONOR) 0.35 MG tablet Take 1 tablet (0.35 mg total) by  mouth daily. 84 tablet 4   No current facility-administered medications on file prior to visit.    Allergies:  Allergies  Allergen Reactions   Natural Vegetable Orange [Psyllium]     Tingling sensation in the tongue, and itchy throat per patient   Past Medical History:   Past Medical History:  Diagnosis Date   Anxiety    Asthma    ALLERGY INDUCED   Depression    Endometriosis    Family history of cancer    Family history of uterine cancer    Headache    MIGRAINES   PCOS (polycystic ovarian syndrome)    Past Surgical History:  Past Surgical History:  Procedure Laterality Date   APPENDECTOMY  22/2979   APPLICATION OF CRANIAL NAVIGATION Left 12/04/2020   Procedure: APPLICATION OF CRANIAL NAVIGATION;  Surgeon: Vallarie Mare, MD;  Location: [facility];  Service: Neurosurgery;  Laterality: Left;   CRANIOTOMY Left 12/04/2020   Procedure: CRANIOTOMY LEFT FRONTAL RESECTION OF BRAIN LESION;  Surgeon: Vallarie Mare, MD;  Location: [facility];  Service: Neurosurgery;  Laterality: Left;   ROBOTIC ASSISTED LAPAROSCOPIC OVARIAN CYSTECTOMY Bilateral 12/12/2018   Procedure: XI ROBOTIC ASSISTED LAPAROSCOPIC OVARIAN CYSTECTOMY, CONSERVATIVE TREATMENT OF ENDOMETRIOSIS;  Surgeon: [name], MD;  Location: [facility];  Service: Gynecology;  Laterality: Bilateral;   Social History:  Social History   Socioeconomic History   Marital status: Single    Spouse name: Not on file   Number of children: Not on file   Years of education: Not on file   Highest education level: Not on file  Occupational History   Not on file  Tobacco Use   Smoking status: Some Days   Smokeless tobacco: Never  Vaping Use   Vaping Use: Every day   Substances: Nicotine  Substance and Sexual Activity   Alcohol use: Yes    Comment: occ   Drug use: Yes    Types: Marijuana   Sexual activity: Not Currently    Comment: 1st intercourse- 16, partners- 22,   Other Topics Concern   Not on file  Social History Narrative   Right handed    Lives with boy friend    One story home    Social Determinants of Health   Financial Resource Strain: Not on file  Food Insecurity: Not on file  Transportation Needs: Not on file  Physical Activity: Not on file  Stress: Not on file   Social Connections: Not on file  Intimate Partner Violence: Not on file   Family History:  Family History  Problem Relation Age of Onset   Hypertension Mother    Thyroid disease Mother    Cancer Father 33       liver, brain and lung cancer, d. 51   Thyroid cancer Paternal Grandmother 8   Uterine cancer Other 64       PGFs sister   Lymphoma Other 31   Prostate cancer Other        MGFs brother,    Review of Systems: Constitutional: Doesn't report fevers, chills or abnormal weight loss Eyes: Doesn't report blurriness of vision Ears, nose, mouth, throat, and face: Doesn't report sore throat Respiratory: Doesn't report cough, dyspnea or wheezes Cardiovascular: Doesn't report palpitation, chest discomfort  Gastrointestinal:  Doesn't report nausea, constipation, diarrhea GU: Doesn't report incontinence Skin: Doesn't report skin rashes Neurological: Per HPI Musculoskeletal: Doesn't report joint pain Behavioral/Psych: Doesn't report anxiety  Physical Exam: Vitals:   09/25/21 1105  BP: 110/70  Pulse: Marland Kitchen)  48  Resp: 20  Temp: 97.7 F (36.5 C)   KPS: 90. General: Alert, cooperative, pleasant, in no acute distress Head: Normal EENT: No conjunctival injection or scleral icterus.  Lungs: Resp effort normal Cardiac: Regular rate Abdomen: Non-distended abdomen Skin: No rashes cyanosis or petechiae. Extremities: No clubbing or edema  Neurologic Exam: Mental Status: Awake, alert, attentive to examiner. Oriented to self and environment. Language is fluent with intact comprehension.  Cranial Nerves: Visual acuity is grossly normal. Visual fields are full. Extra-ocular movements intact. No ptosis. Face is symmetric Motor: Tone and bulk are normal. Power is full in both arms and legs. Reflexes are symmetric, no pathologic reflexes present.  Sensory: Intact to light touch Gait: Normal.   Labs: I have reviewed the data as listed    Component Value Date/Time   NA 140 12/04/2020  0953   K 3.2 (L) 12/04/2020 0953   CL 107 12/04/2020 0953   CO2 24 08/23/2019 1420   GLUCOSE 151 (H) 12/04/2020 0953   BUN 7 12/04/2020 0953   CREATININE 0.50 12/04/2020 0953   CALCIUM 9.3 08/23/2019 1420   PROT 6.4 (L) 09/14/2017 0523   ALBUMIN 3.9 09/14/2017 0523   AST 20 09/14/2017 0523   ALT 16 09/14/2017 0523   ALKPHOS 54 09/14/2017 0523   BILITOT 0.8 09/14/2017 0523   GFRNONAA >60 08/23/2019 1420   GFRAA >60 08/23/2019 1420   Lab Results  Component Value Date   WBC 9.0 12/02/2020   NEUTROABS 3.5 10/31/2010   HGB 11.9 (L) 12/04/2020   HCT 35.0 (L) 12/04/2020   MCV 97.2 12/02/2020   PLT 316 12/02/2020   Imaging:  [facility] Clinician Interpretation: I have personally reviewed the CNS images as listed.  My interpretation, in the context of the patient's clinical presentation, is stable disease  MR BRAIN W WO CONTRAST  Result Date: 09/24/2021 CLINICAL DATA:  Oligodendroglioma, assess treatment response EXAM: MRI HEAD WITHOUT AND WITH CONTRAST TECHNIQUE: Multiplanar, multiecho pulse sequences of the brain and surrounding structures were obtained without and with intravenous contrast. CONTRAST:  8mL MULTIHANCE GADOBENATE DIMEGLUMINE 529 MG/ML IV SOLN COMPARISON:  06/06/2021, 03/07/2021 FINDINGS: Brain: Status post left frontoparietal craniotomy with subjacent resection cavity along the medial left frontal lobe. Previously noted areas of increased T2 hyperintense signal at the anterolateral and superior margin of the resection cavity appear unchanged. No abnormal enhancement or new areas of increased T2 signal. Increased T2 hyperintense signal and thickening in the right insula appear grossly unchanged compared to prior exam. No acute infarction, hemorrhage, hydrocephalus extra-axial collection, mass effect, or midline shift. Vascular: Normal flow voids. Skull and upper cervical spine: Left frontal craniotomy. Otherwise unremarkable. Sinuses/Orbits: Left maxillary mucous retention cyst.  Mild mucosal thickening in the maxillary sinuses and ethmoid air cells. The orbits are unremarkable. Other: The mastoids are well aerated. IMPRESSION: 1. Status post left frontal craniotomy with unchanged T2 hyperintense signal at the anterolateral and superior margins of the resection cavity. 2. Unchanged T2 hyperintense signal in the right insula, which remains concerning for a low-grade glioma versus cortical dysplasia. Electronically Signed   By: Merilyn Baba M.D.   On: 09/24/2021 02:21     Assessment/Plan Oligodendroglioma, IDH gene mutant and 1p/19q-codeleted ([facility]) [C71.9]  ANAISABEL PEDERSON is clinically stable today.  MRI demonstrates stability of T2/FLAIR signal abnormality noted on prior study.  R insular region remains stable as well.  We will continue to defer radiochemotherapy.   We ask that JEANITA CARNEIRO return to clinic in 3 months  following next brain MRI, or sooner as needed.  All questions were answered. The patient knows to call the clinic with any problems, questions or concerns. No barriers to learning were detected.  The total time spent in the encounter was 30 minutes and more than 50% was on counseling and review of test results   Ventura Sellers, MD Medical Director of Neuro-Oncology [facility] at [place] 09/25/21 12:11 PM

## 2021-09-26 ENCOUNTER — Telehealth: Payer: Self-pay | Admitting: Internal Medicine

## 2021-09-26 NOTE — Telephone Encounter (Signed)
Scheduled per 12/15 los, pt has been called and confirmed appt

## 2021-09-29 ENCOUNTER — Inpatient Hospital Stay: Payer: No Typology Code available for payment source

## 2021-12-08 ENCOUNTER — Other Ambulatory Visit: Payer: Self-pay | Admitting: Radiation Therapy

## 2021-12-19 ENCOUNTER — Ambulatory Visit
Admission: RE | Admit: 2021-12-19 | Discharge: 2021-12-19 | Disposition: A | Discharge status: Home / Self Care | Payer: No Typology Code available for payment source | Source: Ambulatory Visit | Attending: Internal Medicine | Admitting: Internal Medicine

## 2021-12-19 DIAGNOSIS — C719 Malignant neoplasm of brain, unspecified: Secondary | ICD-10-CM

## 2021-12-19 MED ORDER — GADOBENATE DIMEGLUMINE 529 MG/ML IV SOLN
10.0000 mL | Freq: Once | INTRAVENOUS | Status: AC | PRN
  Administered 2021-12-19: 10 mL via INTRAVENOUS

## 2021-12-22 ENCOUNTER — Other Ambulatory Visit: Payer: Self-pay

## 2021-12-22 ENCOUNTER — Inpatient Hospital Stay: Payer: No Typology Code available for payment source

## 2021-12-22 ENCOUNTER — Inpatient Hospital Stay: Payer: No Typology Code available for payment source | Attending: Neurosurgery | Admitting: Internal Medicine

## 2021-12-22 VITALS — BP 123/82 | HR 74 | Temp 97.8°F | Resp 16 | Ht 65.0 in | Wt 108.9 lb

## 2021-12-22 DIAGNOSIS — C719 Malignant neoplasm of brain, unspecified: Secondary | ICD-10-CM | POA: Diagnosis not present

## 2021-12-22 DIAGNOSIS — C711 Malignant neoplasm of frontal lobe: Secondary | ICD-10-CM | POA: Diagnosis present

## 2021-12-22 NOTE — Progress Notes (Signed)
[facility] at [place], [place] [address] [phone]   Interval Evaluation  Date of Service: 12/22/21 Patient Name: Lori Collier Patient MRN: [id number] Patient DOB: Oct 06, 1991 Provider: Ventura Sellers, MD  Identifying Statement:  LISSA ROWLES is a 31 y.o. 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 Marcello Moores.  Path is oligodendroglioma IDH-1 mut and 1p/19q co-deleted   05/26/2021 Genetic Testing   Negative genetic testing on the Multicancer panel+RNA.  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  The report date is May 26, 2021.  The Multi-Gene Panel offered by [facility] includes sequencing and/or deletion duplication testing of the following 84 genes: AIP, ALK, APC, ATM, AXIN2,BAP1,  BARD1, BLM, BMPR1A, BRCA1, BRCA2, BRIP1, CASR, CDC73, CDH1, CDK4, CDKN1B, CDKN1C, CDKN2A (p14ARF), CDKN2A (p16INK4a), CEBPA, CHEK2, CTNNA1, DICER1, DIS3L2, EGFR (c.2369C>T, p.Thr790Met variant only), EPCAM (Deletion/duplication testing only), FH, FLCN, GATA2, GPC3, GREM1 (Promoter region deletion/duplication testing only), HOXB13 (c.251G>A, p.Gly84Glu), HRAS, KIT, MAX, MEN1, MET, MITF (c.952G>A, p.Glu318Lys variant only), MLH1, MSH2, MSH3, MSH6, MUTYH, NBN, NF1, NF2, NTHL1, PALB2, PDGFRA, PHOX2B, PMS2, POLD1, POLE, POT1, PRKAR1A, PTCH1, PTEN, RAD50, RAD51C, RAD51D, RB1, RECQL4, RET, RUNX1, SDHAF2, SDHA (sequence changes only), SDHB, SDHC, SDHD, SMAD4, SMARCA4, SMARCB1, SMARCE1, STK11, SUFU, TERC, TERT, TMEM127, TP53, TSC1, TSC2, VHL, WRN and WT1.       Biomarkers:  MGMT Unknown.  IDH 1/2 Mutated.  EGFR Unknown  1p/19q co-deleted   Interval History:  Lori Collier presents for follow up after recent MRI brain.  No new or progressive changes today.  No seizures or  headaches.  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   H+P (12/24/20) Patient presented to medical attention this past October, when she began to experience new onset seizures.  Seizures are described as shaking with impaired awareness, lasting for minutes.  CNS imaging demonstrated 2 foci of FLAIR signal abnormality (R insular, L frontal).  She then underwent resection of the left frontal lesion with Dr. Marcello Moores on 12/04/20, given high suspicion for primary neoplasm.  Following surgery, she has been seizure-free, continues on Keppra 715m BID.  She is fully fucntional and independent, though describes some fatigue and some slowdown in her short term memory.  She presents to clinic today to review pathology and discuss treatment plan options.   Medications: Current Outpatient Medications on File Prior to Visit  Medication Sig Dispense Refill   albuterol (VENTOLIN HFA) 108 (90 Base) MCG/ACT inhaler Inhale 1 puff into the lungs every 4 (four) hours as needed for shortness of breath.     escitalopram (LEXAPRO) 5 MG tablet Take 1 tablet by mouth daily.     fluticasone (FLONASE) 50 MCG/ACT nasal spray Place 1 spray into both nostrils daily as needed for allergies or rhinitis.     ibuprofen (ADVIL) 200 MG tablet Take 400 mg by mouth every 6 (six) hours as needed for moderate pain.     levETIRAcetam (KEPPRA) 500 MG tablet Take 1 tablet (500 mg total) by mouth 2 (two) times daily. 60 tablet 3   loratadine (CLARITIN) 10 MG tablet Take 10 mg by mouth daily as needed for allergies.     Multiple Vitamins-Minerals (ZINC PO) Take 2 tablets by mouth daily.     naproxen sodium (ALEVE) 220 MG tablet Take 660 mg by mouth as needed (pain).  norethindrone (MICRONOR) 0.35 MG tablet Take 1 tablet (0.35 mg total) by mouth daily. 84 tablet 4   ALPRAZolam (XANAX) 0.25 MG tablet Take 0.25 mg by mouth daily as needed. (Patient not taking: Reported on 12/22/2021)     No current facility-administered medications on file prior to  visit.    Allergies:  Allergies  Allergen Reactions   Natural Vegetable Orange [Psyllium]     Tingling sensation in the tongue, and itchy throat per patient   Past Medical History:  Past Medical History:  Diagnosis Date   Anxiety    Asthma    ALLERGY INDUCED   Depression    Endometriosis    Family history of cancer    Family history of uterine cancer    Headache    MIGRAINES   PCOS (polycystic ovarian syndrome)    Past Surgical History:  Past Surgical History:  Procedure Laterality Date   APPENDECTOMY  28/7681   APPLICATION OF CRANIAL NAVIGATION Left 12/04/2020   Procedure: APPLICATION OF CRANIAL NAVIGATION;  Surgeon: Vallarie Mare, MD;  Location: [facility];  Service: Neurosurgery;  Laterality: Left;   CRANIOTOMY Left 12/04/2020   Procedure: CRANIOTOMY LEFT FRONTAL RESECTION OF BRAIN LESION;  Surgeon: Vallarie Mare, MD;  Location: [facility];  Service: Neurosurgery;  Laterality: Left;   ROBOTIC ASSISTED LAPAROSCOPIC OVARIAN CYSTECTOMY Bilateral 12/12/2018   Procedure: XI ROBOTIC ASSISTED LAPAROSCOPIC OVARIAN CYSTECTOMY, CONSERVATIVE TREATMENT OF ENDOMETRIOSIS;  Surgeon: [name], MD;  Location: [facility];  Service: Gynecology;  Laterality: Bilateral;   Social History:  Social History   Socioeconomic History   Marital status: Single    Spouse name: Not on file   Number of children: Not on file   Years of education: Not on file   Highest education level: Not on file  Occupational History   Not on file  Tobacco Use   Smoking status: Some Days   Smokeless tobacco: Never  Vaping Use   Vaping Use: Every day   Substances: Nicotine  Substance and Sexual Activity   Alcohol use: Yes    Comment: occ   Drug use: Yes    Types: Marijuana   Sexual activity: Not Currently    Comment: 1st intercourse- 16, partners- 22,   Other Topics Concern   Not on file  Social History Narrative   Right handed    Lives with boy friend    One story home     Social Determinants of Health   Financial Resource Strain: Not on file  Food Insecurity: Not on file  Transportation Needs: Not on file  Physical Activity: Not on file  Stress: Not on file  Social Connections: Not on file  Intimate Partner Violence: Not on file   Family History:  Family History  Problem Relation Age of Onset   Hypertension Mother    Thyroid disease Mother    Cancer Father 15       liver, brain and lung cancer, d. 40   Thyroid cancer Paternal Grandmother 42   Uterine cancer Other 11       PGFs sister   Lymphoma Other 54   Prostate cancer Other        MGFs brother,    Review of Systems: Constitutional: Doesn't report fevers, chills or abnormal weight loss Eyes: Doesn't report blurriness of vision Ears, nose, mouth, throat, and face: Doesn't report sore throat Respiratory: Doesn't report cough, dyspnea or wheezes Cardiovascular: Doesn't report palpitation, chest discomfort  Gastrointestinal:  Doesn't report nausea, constipation,  diarrhea GU: Doesn't report incontinence Skin: Doesn't report skin rashes Neurological: Per HPI Musculoskeletal: Doesn't report joint pain Behavioral/Psych: Doesn't report anxiety  Physical Exam: Vitals:   12/22/21 1153  BP: 123/82  Pulse: 74  Resp: 16  Temp: 97.8 F (36.6 C)  SpO2: 100%   KPS: 90. General: Alert, cooperative, pleasant, in no acute distress Head: Normal EENT: No conjunctival injection or scleral icterus.  Lungs: Resp effort normal Cardiac: Regular rate Abdomen: Non-distended abdomen Skin: No rashes cyanosis or petechiae. Extremities: No clubbing or edema  Neurologic Exam: Mental Status: Awake, alert, attentive to examiner. Oriented to self and environment. Language is fluent with intact comprehension.  Cranial Nerves: Visual acuity is grossly normal. Visual fields are full. Extra-ocular movements intact. No ptosis. Face is symmetric Motor: Tone and bulk are normal. Power is full in both arms and  legs. Reflexes are symmetric, no pathologic reflexes present.  Sensory: Intact to light touch Gait: Normal.   Labs: I have reviewed the data as listed    Component Value Date/Time   NA 140 12/04/2020 0953   K 3.2 (L) 12/04/2020 0953   CL 107 12/04/2020 0953   CO2 24 08/23/2019 1420   GLUCOSE 151 (H) 12/04/2020 0953   BUN 7 12/04/2020 0953   CREATININE 0.50 12/04/2020 0953   CALCIUM 9.3 08/23/2019 1420   PROT 6.4 (L) 09/14/2017 0523   ALBUMIN 3.9 09/14/2017 0523   AST 20 09/14/2017 0523   ALT 16 09/14/2017 0523   ALKPHOS 54 09/14/2017 0523   BILITOT 0.8 09/14/2017 0523   GFRNONAA >60 08/23/2019 1420   GFRAA >60 08/23/2019 1420   Lab Results  Component Value Date   WBC 9.0 12/02/2020   NEUTROABS 3.5 10/31/2010   HGB 11.9 (L) 12/04/2020   HCT 35.0 (L) 12/04/2020   MCV 97.2 12/02/2020   PLT 316 12/02/2020   Imaging:  [facility] Clinician Interpretation: I have personally reviewed the CNS images as listed.  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 Multifocal. WHO grade II. Restaging. EXAM: MRI HEAD WITHOUT AND WITH CONTRAST TECHNIQUE: Multiplanar, multiecho pulse sequences of the brain and surrounding structures were obtained without and with intravenous contrast. CONTRAST:  61m MULTIHANCE GADOBENATE DIMEGLUMINE 529 MG/ML IV SOLN COMPARISON:  Brain MRI 09/23/2021 and earlier. FINDINGS: Brain: Anterior left superior frontal gyrus resection cavity with stable size and configuration since May 2022. Minimal marginal T2 and FLAIR hyperintensity (such as series 8, image 21 today) is stable from recent comparisons, and not significantly changed from 03/07/2021. No regional mass effect. Stable curvilinear vascular related enhancement. No suspicious  postcontrast enhancement there. No suspicious DWI changes. Second right insula area of involvement with stable size and configuration of T2/FLAIR hyperintensity in an area of about 2 x 3 cm since 03/07/2021 (series 8, image 15 today). No enhancement following contrast. Diffusion remains facilitated. No significant regional mass effect. No superimposed restricted diffusion to suggest acute infarction. No midline shift, ventriculomegaly, or acute intracranial hemorrhage. Cervicomedullary junction and pituitary are within normal limits. Outside of the above areas gray and white matter signal remains normal. Incidental choroid plexus cysts. No abnormal enhancement or dural thickening identified. Vascular: Major intracranial vascular flow voids are stable. The major dural venous sinuses are enhancing and appear to be patent. Skull and upper cervical spine: Stable left anterior  craniotomy. Background bone marrow signal is within normal limits. Negative visible cervical spine. Sinuses/Orbits: Paranasal sinus mucosal thickening and left maxillary sinus retention cysts not significantly changed from last year. Orbits appear stable and negative. Other: Mastoids remain clear. Visible internal auditory structures appear normal. Negative visible scalp and face. IMPRESSION: 1. No significant change in the anterior left superior frontal gyrus resection cavity since May 2022, with no progressive or suspicious features identified. And continued stability of similar right insula lesion, presumed multifocal low-grade tumor. 2. No new intracranial abnormality. Electronically Signed   By: Genevie Ann M.D.   On: 12/19/2021 12:41     Assessment/Plan Oligodendroglioma, IDH gene mutant and 1p/19q-codeleted ([place]) [C71.9]  Corrinna N Mejorado is clinically stable today.  MRI demonstrates stability of T2/FLAIR signal abnormality noted on prior study.  R insular region remains stable as well.    No new or progressive deficits, seizures well  controlled.  We will continue to defer radiochemotherapy.   We ask that SHALAYNE LEACH return to clinic in 4 months following next brain MRI, or sooner as needed.  All questions were answered. The patient knows to call the clinic with any problems, questions or concerns. No barriers to learning were detected.  The total time spent in the encounter was 30 minutes and more than 50% was on counseling and review of test results   Ventura Sellers, MD Medical Director of Neuro-Oncology [facility] at [place] 12/22/21 12:07 PM

## 2021-12-23 ENCOUNTER — Encounter: Payer: Self-pay | Admitting: Internal Medicine

## 2021-12-24 ENCOUNTER — Other Ambulatory Visit (HOSPITAL_COMMUNITY)
Admission: RE | Admit: 2021-12-24 | Discharge: 2021-12-24 | Disposition: A | Discharge status: Home / Self Care | Payer: No Typology Code available for payment source | Source: Ambulatory Visit | Attending: Obstetrics & Gynecology | Admitting: Obstetrics & Gynecology

## 2021-12-24 ENCOUNTER — Other Ambulatory Visit: Payer: Self-pay

## 2021-12-24 ENCOUNTER — Encounter: Payer: Self-pay | Admitting: Obstetrics & Gynecology

## 2021-12-24 ENCOUNTER — Ambulatory Visit (INDEPENDENT_AMBULATORY_CARE_PROVIDER_SITE_OTHER): Payer: No Typology Code available for payment source | Admitting: Obstetrics & Gynecology

## 2021-12-24 VITALS — BP 102/68 | HR 68 | Resp 16 | Ht 64.0 in | Wt 107.0 lb

## 2021-12-24 DIAGNOSIS — Z113 Encounter for screening for infections with a predominantly sexual mode of transmission: Secondary | ICD-10-CM | POA: Insufficient documentation

## 2021-12-24 DIAGNOSIS — N6312 Unspecified lump in the right breast, upper inner quadrant: Secondary | ICD-10-CM

## 2021-12-24 DIAGNOSIS — Z01419 Encounter for gynecological examination (general) (routine) without abnormal findings: Secondary | ICD-10-CM | POA: Diagnosis present

## 2021-12-24 DIAGNOSIS — Z3041 Encounter for surveillance of contraceptive pills: Secondary | ICD-10-CM | POA: Diagnosis not present

## 2021-12-24 MED ORDER — NORETHINDRONE 0.35 MG PO TABS: 1.0000 | ORAL_TABLET | Freq: Every day | ORAL | 4 refills | Status: DC

## 2021-12-24 NOTE — Progress Notes (Signed)
? ? ?Lori Collier 1991-08-30 [id number] ? ? ?History:    31 y.o. G0  Boyfriend x 05/2021.  Nursing school. ?  ?RP: BTB on DepoProvera for severe Endometriosis ?  ?HPI: S/P Robotic Bilateral Ovarian Cystectomies for Endometriosis on 12/12/2018.  Well on the Progestin pill.  No pelvic pain.  Sexually active currently.  Pap 11/2020 Neg.  Pap/Gono-Chlam done. Left breast normal.  Small lump felt on the Right breast.  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  Patho: 2 Oligodendrogliomas grade 2.  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  No known increased cancer risk based on her genetic testing.   BMI 18.37.  ? ? ?Past medical history,surgical history, family history and social history were all reviewed and documented in the EPIC chart. ? ?Gynecologic History ?Patient's last menstrual period was 12/17/2021 (exact date). ? ?Obstetric History ?OB History  ?Gravida Para Term Preterm AB Living  ?0 0 0 0 0 0  ?SAB IAB Ectopic Multiple Live Births  ?0 0 0 0 0  ? ? ? ?ROS: A ROS was performed and pertinent positives and negatives are included in the history. ? GENERAL: No fevers or chills. HEENT: No change in vision, no earache, sore throat or sinus congestion. NECK: No pain or stiffness. CARDIOVASCULAR: No chest pain or pressure. No palpitations. PULMONARY: No shortness of breath, cough or wheeze. GASTROINTESTINAL: No abdominal pain, nausea, vomiting or diarrhea, melena or bright red blood per rectum. GENITOURINARY: No urinary frequency, urgency, hesitancy or dysuria. MUSCULOSKELETAL: No joint or muscle pain, no back pain, no recent trauma. DERMATOLOGIC: No rash, no itching, no lesions. ENDOCRINE: No polyuria, polydipsia, no heat or cold intolerance. No recent change in weight. HEMATOLOGICAL: No anemia or easy bruising or bleeding. NEUROLOGIC: No headache, seizures, numbness, tingling or weakness. PSYCHIATRIC: No depression, no loss of interest in normal activity or change in sleep pattern.   ?  ? ?Exam: ? ? ?BP 102/68   Pulse 68   Resp 16   Ht '5\' 4"'$  (1.626 m)   Wt 107 lb (48.5 kg)   LMP 12/17/2021 (Exact Date) Comment: birth control pill  BMI 18.37 kg/m?  ? ?Body mass index is 18.37 kg/m?. ? ?General appearance : Well developed well nourished female. No acute distress ?HEENT: Eyes: no retinal hemorrhage or exudates,  Neck supple, trachea midline, no carotid bruits, no thyroidmegaly ?Lungs: Clear to auscultation, no rhonchi or wheezes, or rib retractions  ?Heart: Regular rate and rhythm, no murmurs or gallops ?Breast:Examined in sitting and supine position were symmetrical in appearance.  Left no palpable masses or tenderness,  no skin retraction, no nipple inversion, no nipple discharge, no skin discoloration.  Right very small oval, mobile nodule/cyst at 3 O'Clock externally.  Density possibly present at 3 O'Clock close to the nipple.  No axillary or supraclavicular lymphadenopathy bilaterally. ?Abdomen: no palpable masses or tenderness, no rebound or guarding ?Extremities: no edema or skin discoloration or tenderness ? ?Pelvic: Vulva: Normal ?            Vagina: No gross lesions or discharge ? Cervix: No gross lesions or discharge.  Pap reflex/Gono-Chlam done. ? Uterus  AV, normal size, shape and consistency, non-tender and mobile ? Adnexa  Without masses or tenderness ? Anus: Normal ? ? ?Assessment/Plan:  31 y.o. female for annual exam  ? ?1. Encounter for routine gynecological examination with Papanicolaou smear of cervix ?S/P Robotic Bilateral Ovarian Cystectomies for Endometriosis on 12/12/2018.  Well on the  Progestin pill.  No pelvic pain.  Sexually active currently.  Pap 11/2020 Neg.  Pap/Gono-Chlam done. Left breast normal.  Small lump felt on the Right breast.  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  Patho: 2 Oligodendrogliomas grade 2.  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  No known increased cancer risk based on her genetic testing.   BMI 18.37.   ?- Cytology - PAP( [place]) ? ?2. Encounter for surveillance of contraceptive pills ?Well on the Progestin pill.  No CI to continue.  Controlling Sxs of Endometriosis.  Prescription sent to pharmacy. ? ?3. Screen for STD (sexually transmitted disease) ?Gono-Chlam on Pap. ?- Cytology - PAP( [place]) ? ?4. Mass of upper inner quadrant of right breast ?Right very small oval, mobile nodule/cyst at 3 O'Clock externally.  Density possibly present at 3 O'Clock close to the nipple.  Schedule a Rt breast US, possible Rt Dx mammo. ? ?Other orders ?- norethindrone (MICRONOR) 0.35 MG tablet; Take 1 tablet (0.35 mg total) by mouth daily.  ? ?[name] MD, 4:48 PM 12/24/2021 ? ?  ?

## 2021-12-25 ENCOUNTER — Telehealth: Payer: Self-pay

## 2021-12-25 ENCOUNTER — Ambulatory Visit: Payer: No Typology Code available for payment source | Admitting: Internal Medicine

## 2021-12-25 NOTE — Telephone Encounter (Signed)
Schedule a Rt breast US, Rt Dx mammo per radiologist ?Received: Yesterday ?[name], MD  P [facility] Triage ? ?Right very small oval, mobile nodule/cyst at 3 O'Clock externally.  Density possibly present at 3 O'Clock close to the nipple.  Schedule a Right breast U/S, possible Rt Dx mammo.  ?

## 2021-12-25 NOTE — Telephone Encounter (Signed)
Spoke with patient and informed her of date/time. 

## 2021-12-25 NOTE — Telephone Encounter (Signed)
Right breast u/s order placed. Spoke with Judeen Hammans at [facility] and scheduled patient for Friday, 01/09/22 at 10:00am.  Check in 9:45am. ?

## 2021-12-29 LAB — CYTOLOGY - PAP
Chlamydia: NEGATIVE
Comment: NEGATIVE
Comment: NORMAL
Diagnosis: NEGATIVE
Neisseria Gonorrhea: NEGATIVE

## 2021-12-31 ENCOUNTER — Telehealth: Payer: Self-pay | Admitting: Dietician

## 2021-12-31 NOTE — Telephone Encounter (Signed)
Patient screened on MST. First attempt to reach. Provided my cell# on voice mail to return call to set up a nutrition consult. Sent text with my contact information as well. ? ?April Manson, RDN, LDN ?Registered Dietitian, [place] ?Part Time Remote (Usual office hours: Tuesday-Thursday) ?Cell: [phone]   ?

## 2022-01-06 ENCOUNTER — Telehealth: Payer: Self-pay | Admitting: Dietician

## 2022-01-06 NOTE — Telephone Encounter (Signed)
Patient screened on MST. Second attempt to reach. Provided my cell# on voice mail and through text to return call to set up a nutrition consult. ? ?April Manson, RDN, LDN ?Registered Dietitian, [place] ?Part Time Remote (Usual office hours: Tuesday-Thursday) ?Cell: [phone]   ?

## 2022-01-09 ENCOUNTER — Other Ambulatory Visit: Payer: Self-pay | Admitting: Obstetrics & Gynecology

## 2022-01-09 ENCOUNTER — Ambulatory Visit
Admission: RE | Admit: 2022-01-09 | Discharge: 2022-01-09 | Disposition: A | Discharge status: Home / Self Care | Payer: No Typology Code available for payment source | Source: Ambulatory Visit | Attending: Obstetrics & Gynecology | Admitting: Obstetrics & Gynecology

## 2022-01-09 DIAGNOSIS — N6310 Unspecified lump in the right breast, unspecified quadrant: Secondary | ICD-10-CM

## 2022-01-21 ENCOUNTER — Other Ambulatory Visit: Payer: Self-pay | Admitting: Obstetrics & Gynecology

## 2022-01-26 ENCOUNTER — Inpatient Hospital Stay: Admission: RE | Admit: 2022-01-26 | Payer: No Typology Code available for payment source | Source: Ambulatory Visit

## 2022-01-29 ENCOUNTER — Telehealth: Payer: Self-pay | Admitting: Dietician

## 2022-01-29 NOTE — Telephone Encounter (Signed)
Third and final attempt to reach patient by telephone. Have left messages with return number. Also sent email with contact information.  Please consult RD for future needs.   ? ?April Manson, RDN, LDN ?Registered Dietitian, [place] ?Part Time Remote (Usual office hours: Tuesday-Thursday) ?Cell: [phone]  ?

## 2022-01-30 ENCOUNTER — Encounter: Payer: Self-pay | Admitting: Dietician

## 2022-01-30 NOTE — Telephone Encounter (Signed)
Received email from patient. Replied encouraging use of healthy fats. Sent recipes for smoothies and link to [email or web address]. ? ?See patient's email below. ? ? ?Hi [name], ? ?I've tried to reach back a couple times, I think it's been right after your work hours. I've always struggled with my weight, I was on depo-provera for about two years and got up to 140 lbs but lost it all this past year after I got off of it. I've recently been drinking Premier Protein drinks in my smoothies and coffee in the mornings. I've been able to maintain my weight right at 115 lately. 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 Since my diagnosis I've also cut out sugar, so that's played a part too I believe. It's hard to eat healthy and keep on weight with my high metabolism, (it runs heavy on my dad's side of the family, we're all thin.) I was beginning to get concerned when I wasn't able to stay at 110 lbs, I still am not happy with my current weight but I do not think there is anything else I can do to get back up to ~125 lbs.  ? ?Thanks for your concern and consideration, I will take any tips you may be able to offer. ? ?Lori Collier ? ?

## 2022-02-05 ENCOUNTER — Ambulatory Visit
Admission: RE | Admit: 2022-02-05 | Discharge: 2022-02-05 | Disposition: A | Discharge status: Home / Self Care | Payer: No Typology Code available for payment source | Source: Ambulatory Visit | Attending: Obstetrics & Gynecology | Admitting: Obstetrics & Gynecology

## 2022-02-05 DIAGNOSIS — N6310 Unspecified lump in the right breast, unspecified quadrant: Secondary | ICD-10-CM

## 2022-03-19 ENCOUNTER — Telehealth: Payer: Self-pay | Admitting: *Deleted

## 2022-03-19 DIAGNOSIS — C719 Malignant neoplasm of brain, unspecified: Secondary | ICD-10-CM

## 2022-03-19 NOTE — Telephone Encounter (Signed)
Patient called to report that she got a job with [facility] [employer] [facility] and it would be cheaper for her to be seen there.   Follow up is due July 7th.  Requesting referral to [facility] to Dr Guillermina [place].

## 2022-04-20 ENCOUNTER — Ambulatory Visit: Payer: No Typology Code available for payment source | Admitting: Internal Medicine

## 2022-04-30 ENCOUNTER — Other Ambulatory Visit: Payer: Self-pay | Admitting: Obstetrics & Gynecology

## 2022-04-30 ENCOUNTER — Other Ambulatory Visit: Payer: Self-pay | Admitting: Internal Medicine

## 2022-04-30 DIAGNOSIS — G40009 Localization-related (focal) (partial) idiopathic epilepsy and epileptic syndromes with seizures of localized onset, not intractable, without status epilepticus: Secondary | ICD-10-CM

## 2022-04-30 NOTE — Telephone Encounter (Signed)
Patient transferred her care to [facility].  They should be able to process refills for the patient.

## 2022-04-30 NOTE — Telephone Encounter (Signed)
Left voice mail for patient. This Rx was sent to [place] in March 2023 for the year. I called patient and asked her to confirm where she wanted to get it since request today was from [facility] in [place]. I asked her to call us back.

## 2022-05-01 NOTE — Telephone Encounter (Signed)
Patient called back reports she no longer uses [facility],it has been removed. Rx sent.

## 2022-06-03 IMAGING — MR MR HEAD WO/W CM
14 series · 48 of 48 positions shown · IV contrast (12 ML MULTIHANCE)
Comparison: Brain MRI 07/17/2020.

CLINICAL DATA: Abnormal brain MRI. Localization related idiopathic
epilepsy and epileptic syndromes with seizure of localized onset,
not intractable, without status epilepticus.

EXAM:
MRI HEAD WITHOUT AND WITH CONTRAST
TECHNIQUE: Multiplanar, multiecho pulse sequences of the brain and surrounding
structures were obtained without and with intravenous contrast.
CONTRAST:  12mL MULTIHANCE GADOBENATE DIMEGLUMINE 529 MG/ML IV SOLN

[Series 5: T1 · sagittal · 4.0mm · 0.75mm/px · 2 of 27 slices shown (1 of 3)]
[im 1/27]
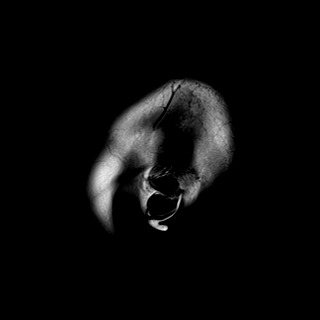
[im 27/27]
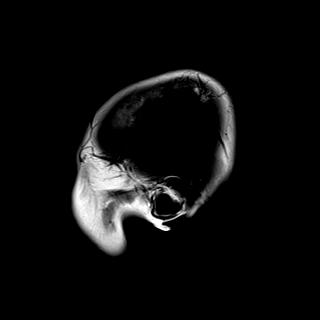

[Series 6: DWI · axial · 3.0mm · 0.94mm/px · z∈[-68,+78]mm · 9 of 160 slices shown]
[im 1/160]
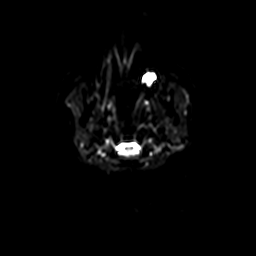
[im 20/160]
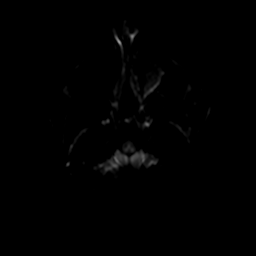
[im 40/160]
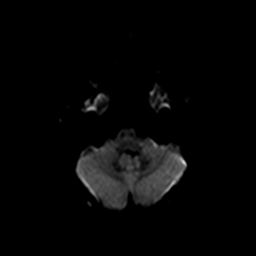
[im 60/160]
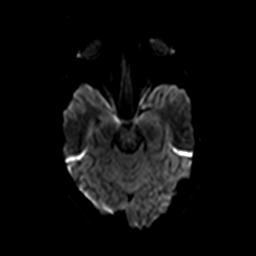
[im 80/160]
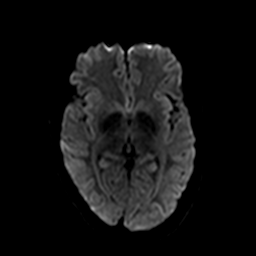
[im 100/160]
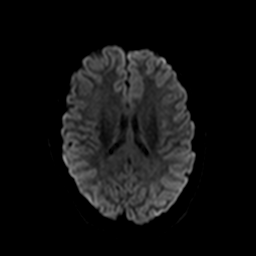
[im 120/160]
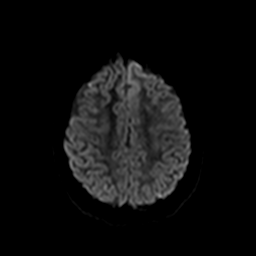
[im 140/160]
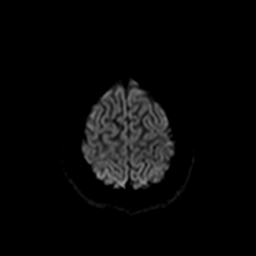
[im 160/160]
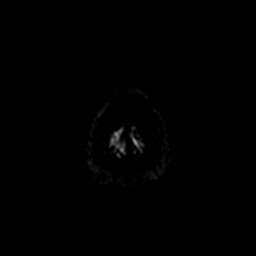

[Series 7: ax dwi_tracew · axial · 3.0mm · 0.94mm/px · z∈[-68,+78]mm · 4 of 80 slices shown]
[im 1/80]
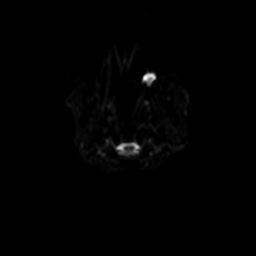
[im 27/80]
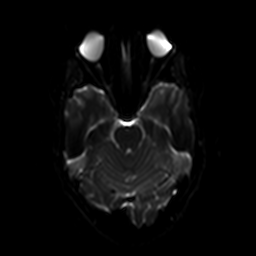
[im 53/80]
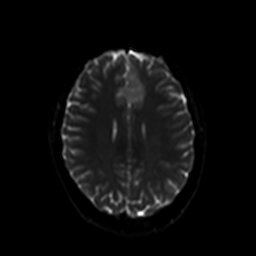
[im 80/80]
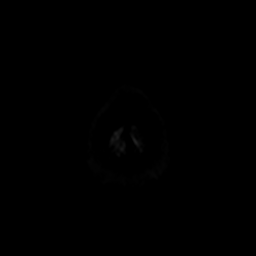

[Series 8: ax dwi_adc · axial · 3.0mm · 0.94mm/px · z∈[-68,+78]mm · 2 of 40 slices shown]
[im 1/40]
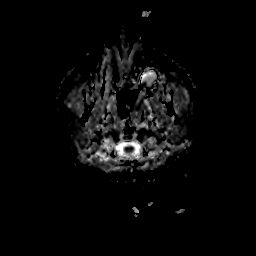
[im 40/40]
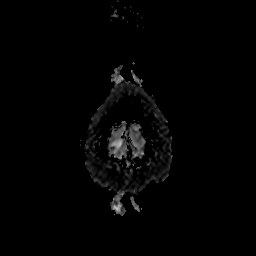

[Series 9: T2 · axial · 4.0mm · 0.36mm/px · z∈[-69,+77]mm · 2 of 29 slices shown (1 of 2)]
[im 1/29]
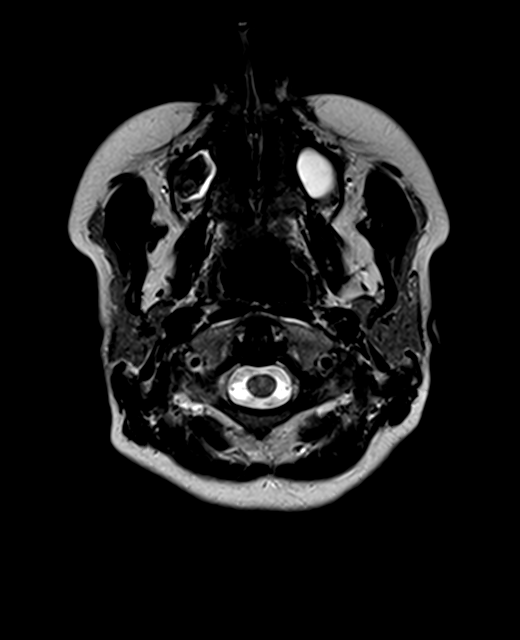
[im 29/29]
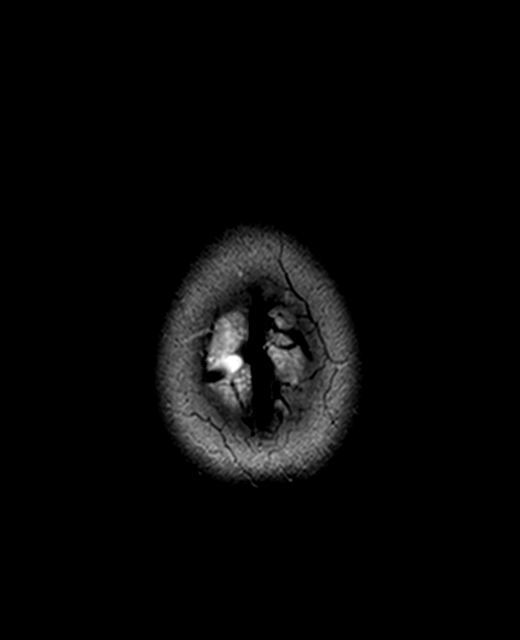

[Series 10: FLAIR · axial · 3.0mm · 0.72mm/px · 1 of 26 slices shown (1 of 2)]
[im 1/26]
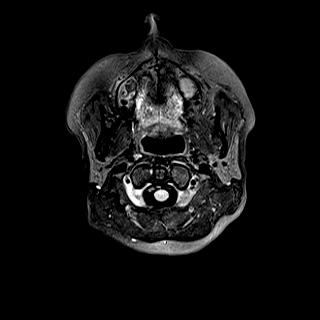

[Series 12: swi_images · axial · 2.2mm · 0.90mm/px · z∈[-65,+73]mm · 3 of 64 slices shown]
[im 1/64]
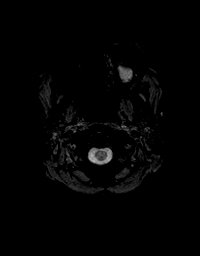
[im 32/64]
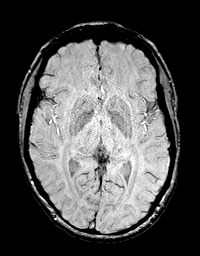
[im 64/64]
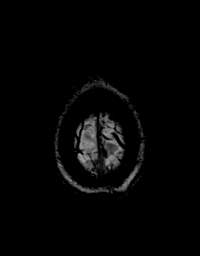

[Series 13: T1 · axial · 1.0mm · 0.94mm/px · z∈[-76,+83]mm · 9 of 160 slices shown (2 of 3)]
[im 1/160]
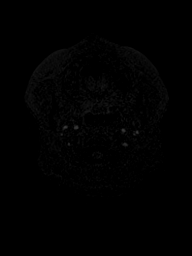
[im 20/160]
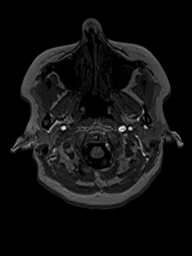
[im 40/160]
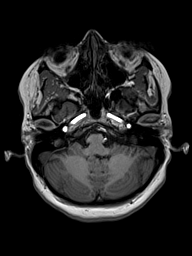
[im 60/160]
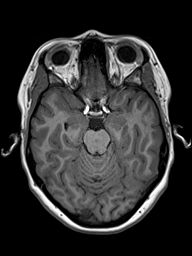
[im 80/160]
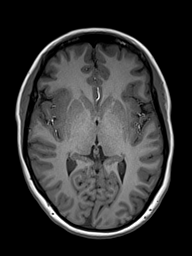
[im 100/160]
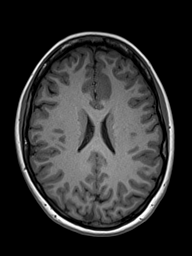
[im 120/160]
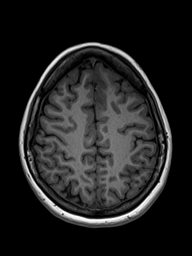
[im 140/160]
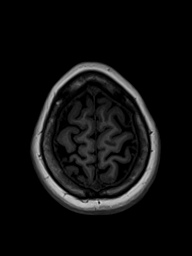
[im 160/160]
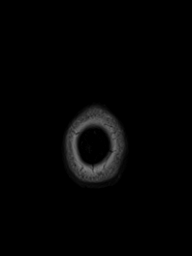

[Series 14: T2 · coronal · 3.0mm · 0.47mm/px · 1 of 25 slices shown (2 of 2)]
[im 1/25]
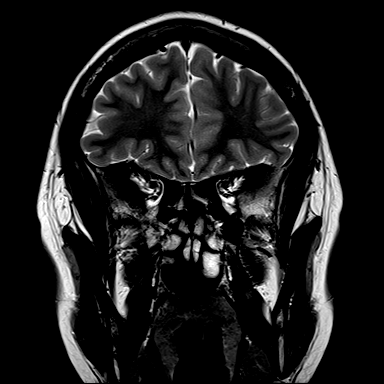

[Series 15: FLAIR · coronal · 3.0mm · 0.56mm/px · 1 of 25 slices shown (2 of 2)]
[im 1/25]
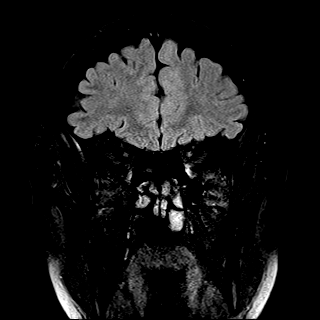

[Series 16: T2 post-contrast · coronal · 4.0mm · 0.36mm/px · 2 of 34 slices shown]
[im 1/34]
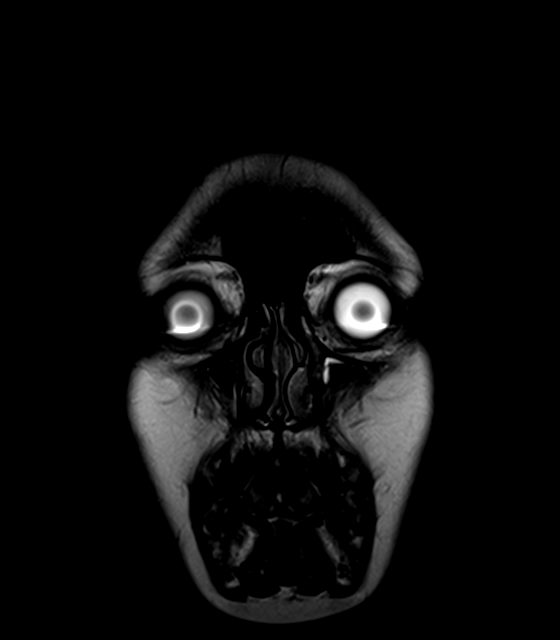
[im 34/34]
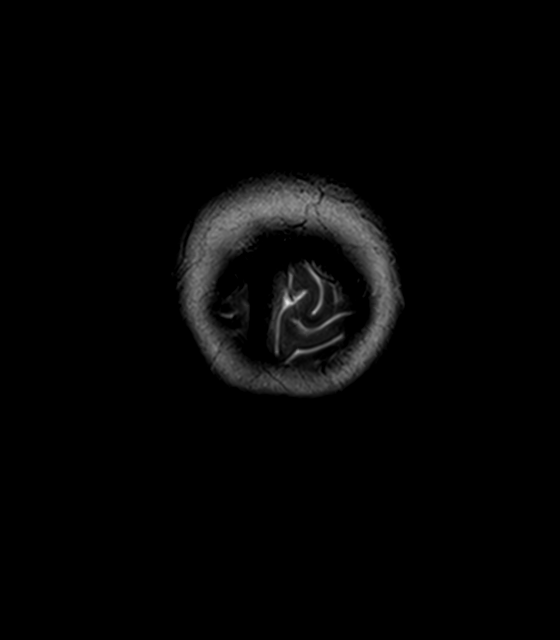

[Series 17: T1 · axial · 1.0mm · 0.94mm/px · z∈[-76,+83]mm · 9 of 160 slices shown (3 of 3)]
[im 1/160]
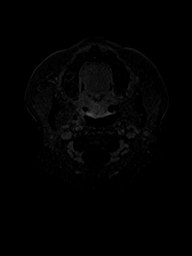
[im 20/160]
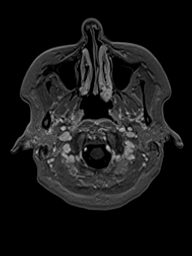
[im 40/160]
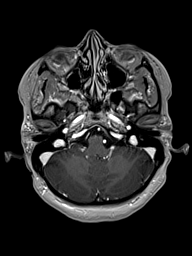
[im 60/160]
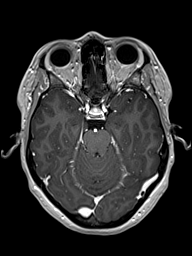
[im 80/160]
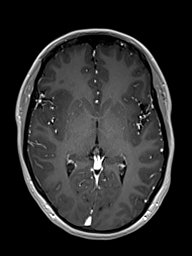
[im 100/160]
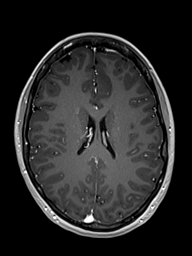
[im 120/160]
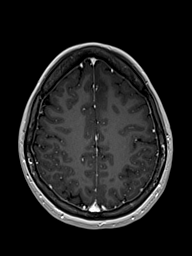
[im 140/160]
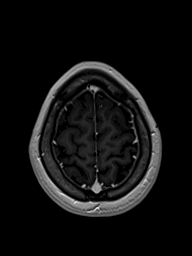
[im 160/160]
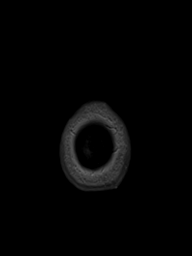

[Series 18: T1 post-contrast · coronal · 4.0mm · 0.72mm/px · 2 of 34 slices shown (1 of 2)]
[im 1/34]
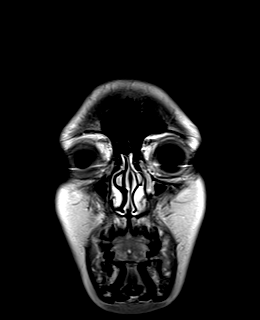
[im 34/34]
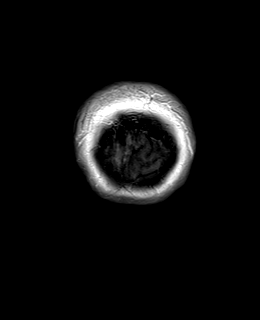

[Series 19: T1 post-contrast · sagittal · 4.0mm · 0.75mm/px · 1 of 27 slices shown (2 of 2)]
[im 1/27]
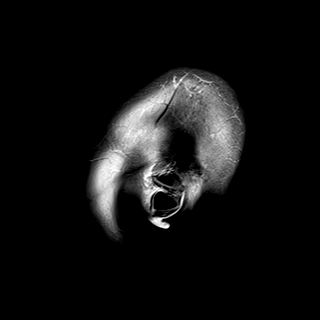

[48 of 48 positions shown; findings below may reference images not displayed]

FINDINGS: Brain:

Cerebral volume is normal.

Again demonstrated are two focal areas of T2 hyperintensity
involving the cortex and subcortical white matter, one within the
right insula (for instance as seen on series 10, image 13) and the
other within the paramedian mid-to-anterior left frontal lobe (for
instance as seen on series 10, image 18). These foci have not
appreciably changed in extent or appearance as compared to the MRI
of 07/17/2020. As before, there is mild swelling/expansion but no
corresponding abnormal enhancement.

No new focus of parenchymal signal abnormality is identified. The
hippocampi are symmetric in size and signal.

There is no acute infarct.

No chronic intracranial blood products.

No extra-axial fluid collection.

No midline shift.

Vascular: Expected proximal arterial flow voids. Incidentally noted
small developmental venous anomaly versus capillary telangiectasia
within the pons (series 17, image 45).

Skull and upper cervical spine: No focal marrow lesion.

Sinuses/Orbits: Visualized orbits show no acute finding. Trace
bilateral ethmoid and maxillary sinus mucosal thickening. 3 cm left
maxillary sinus mucous retention cyst.
IMPRESSION: Two focal areas of T2 hyperintensity involving the cortex and
subcortical white matter within the right insula and paramedian
mid-to-anterior left frontal lobe, stable as compared to the brain
MRI of 07/17/2020. As before, there is mild swelling/expansion but
no corresponding abnormal enhancement. Primary differential
considerations are unchanged and include low-grade glioma versus
focal cortical dysplasia.

## 2024-07-12 ENCOUNTER — Other Ambulatory Visit: Payer: Self-pay | Admitting: Medical Genetics
